# Patient Record
Sex: Male | Born: 1970 | Race: White | Hispanic: No | Marital: Married | Smoking: Former smoker
Health system: Southern US, Community
[De-identification: ages and names within clinical notes are randomized; demographics above are authoritative.]

## PROBLEM LIST (undated history)

## (undated) DIAGNOSIS — I219 Acute myocardial infarction, unspecified: Secondary | ICD-10-CM

## (undated) DIAGNOSIS — I2510 Atherosclerotic heart disease of native coronary artery without angina pectoris: Secondary | ICD-10-CM

## (undated) DIAGNOSIS — Z79890 Hormone replacement therapy (postmenopausal): Secondary | ICD-10-CM

## (undated) DIAGNOSIS — K219 Gastro-esophageal reflux disease without esophagitis: Secondary | ICD-10-CM

## (undated) DIAGNOSIS — E785 Hyperlipidemia, unspecified: Secondary | ICD-10-CM

## (undated) DIAGNOSIS — I10 Essential (primary) hypertension: Secondary | ICD-10-CM

## (undated) HISTORY — PX: CORONARY ARTERY BYPASS GRAFT: SHX141

---

## 2005-07-20 ENCOUNTER — Emergency Department: Payer: Self-pay | Admitting: Internal Medicine

## 2006-05-13 ENCOUNTER — Emergency Department (HOSPITAL_COMMUNITY): Admission: EM | Admit: 2006-05-13 | Discharge: 2006-05-13 | Payer: Self-pay | Admitting: Emergency Medicine

## 2008-01-15 IMAGING — CT CT HEAD WITHOUT CONTRAST
2 series · 16 of 30 positions shown, 20 images · non-contrast
Comparison: none

REASON FOR EXAM: Headache  [HOSPITAL]
COMMENTS:

PROCEDURE:     CT  - CT HEAD WITHOUT CONTRAST  - July 20, 2005  [DATE]
RESULT:       The ventricles are normal in size and position.  There is no
evidence of a mass nor mass effect.  There is no intracranial hemorrhage.
At bone window settings, I see no evidence of an acute skull fracture.

[Series 2: without · axial · non-contrast · 0.41mm/px · z∈[+738,+868]mm · 13 of 32 slices shown, 17 images]
[im 3/32  brain]
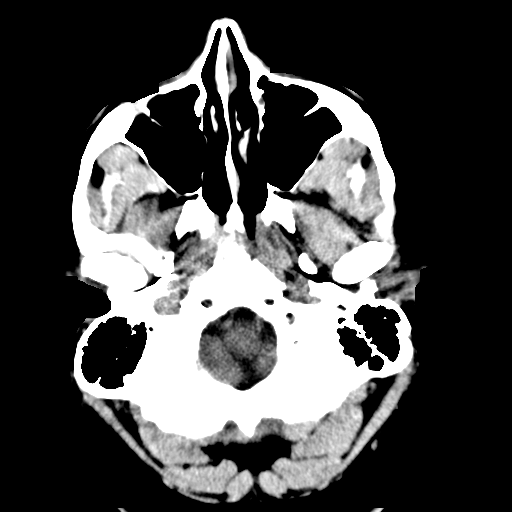
[im 3/32  bone]
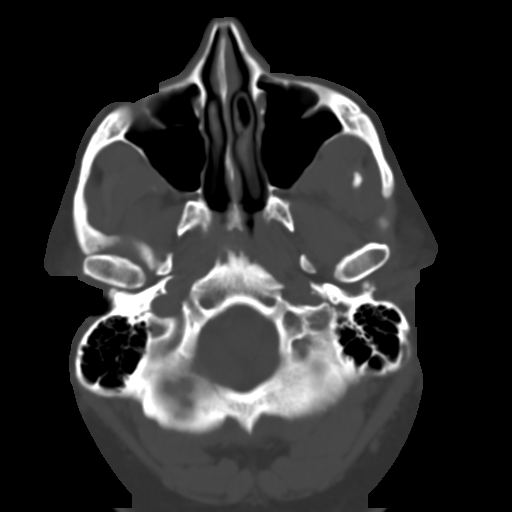
[im 5/32  brain]
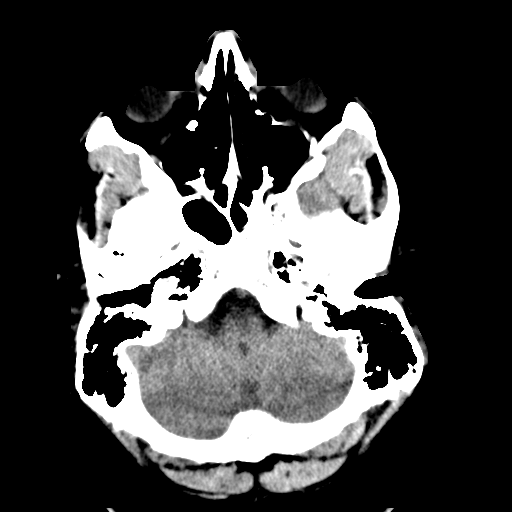
[im 7/32  brain]
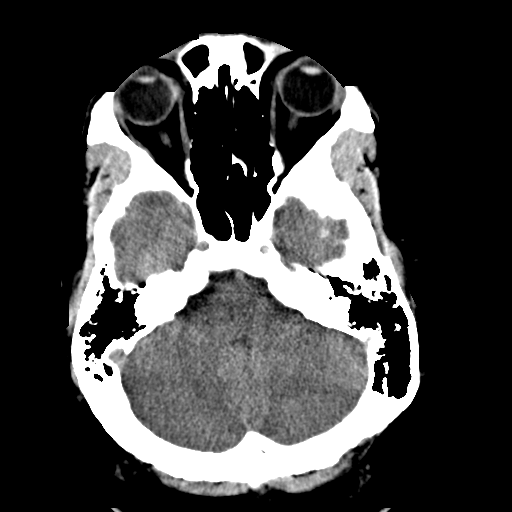
[im 9/32  brain]
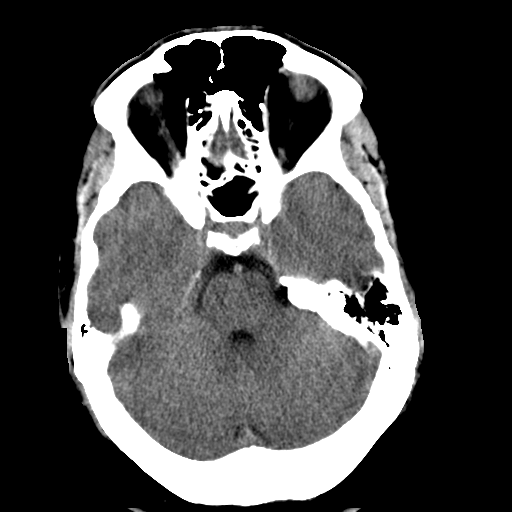
[im 12/32  brain]
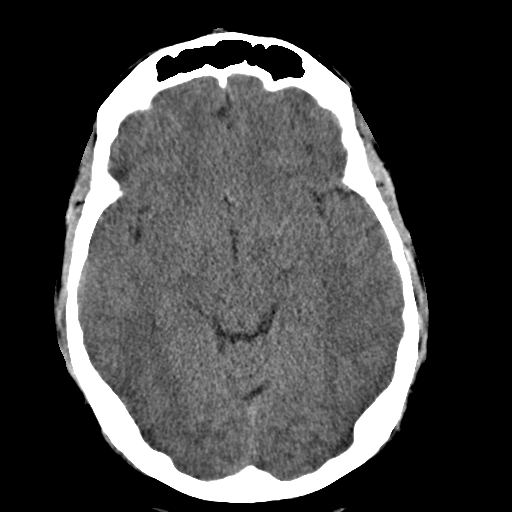
[im 12/32  bone]
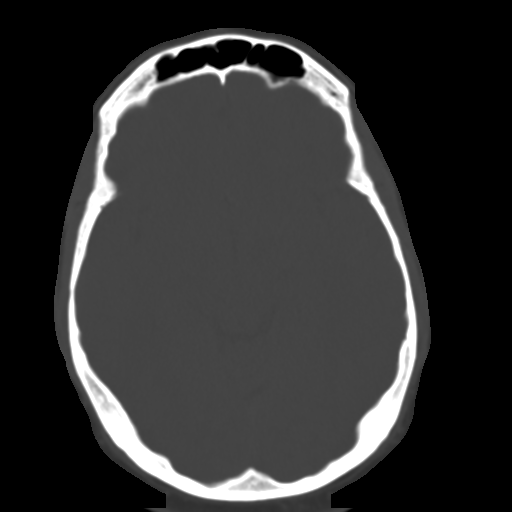
[im 14/32  brain]
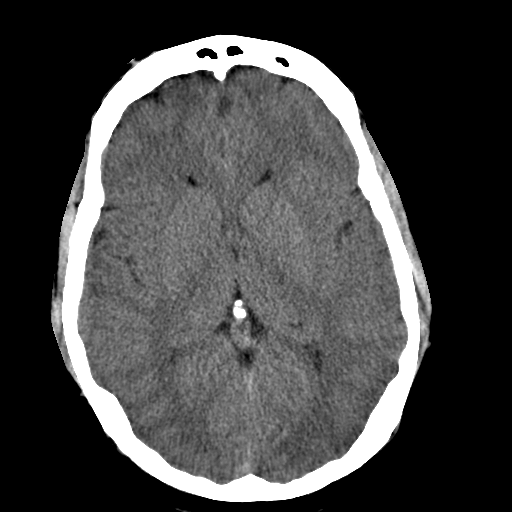
[im 16/32  brain]
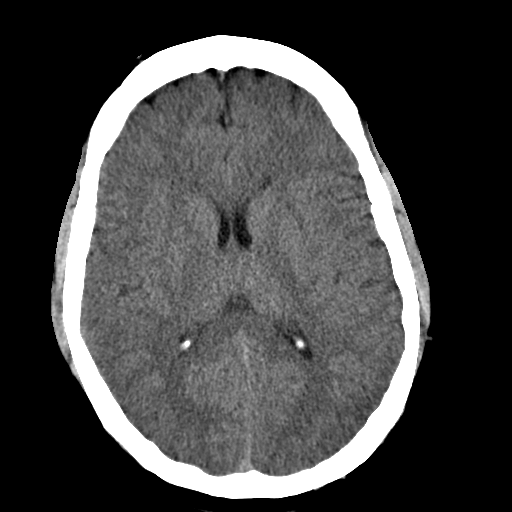
[im 18/32  brain]
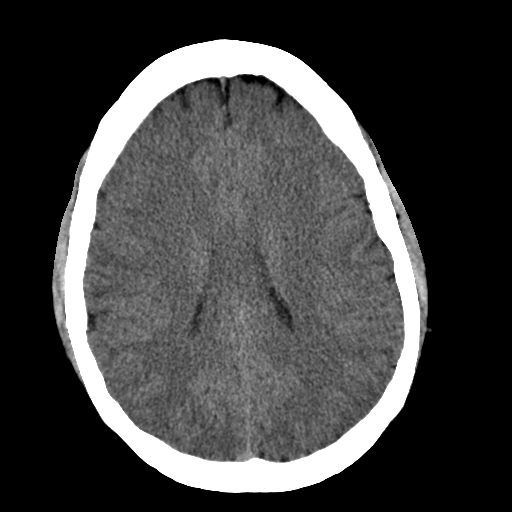
[im 20/32  brain]
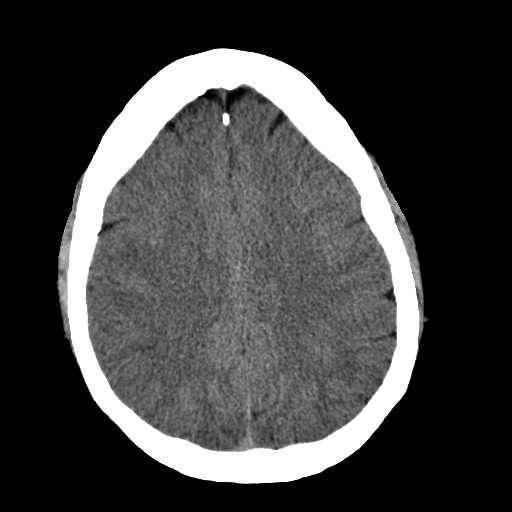
[im 20/32  bone]
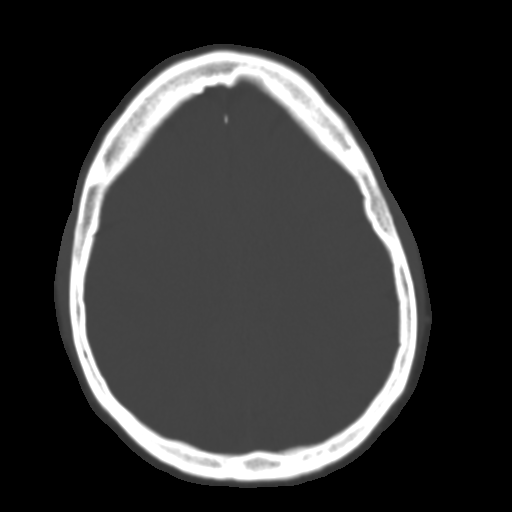
[im 23/32  brain]
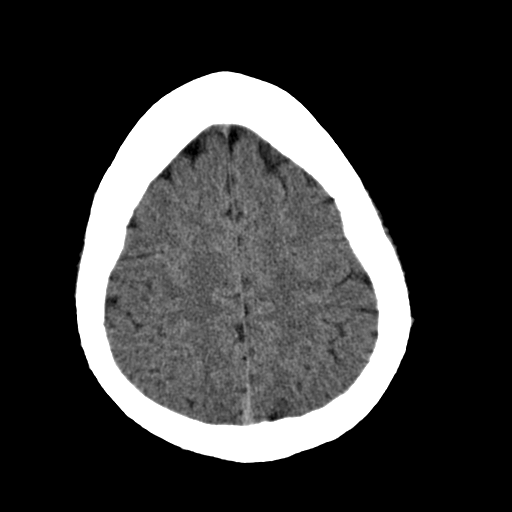
[im 25/32  brain]
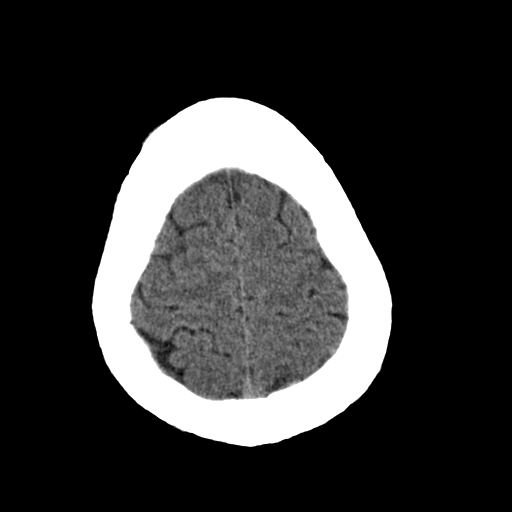
[im 27/32  brain]
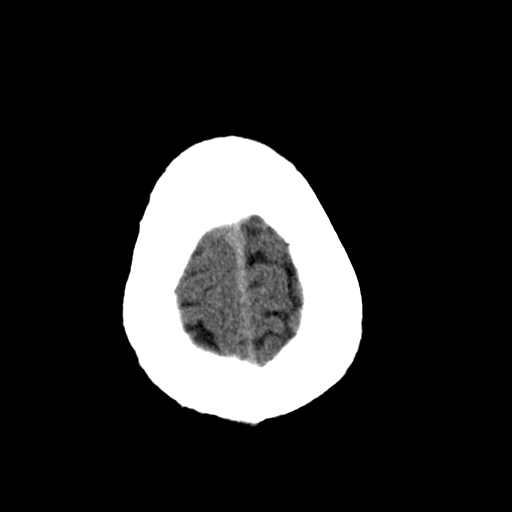
[im 29/32  brain]
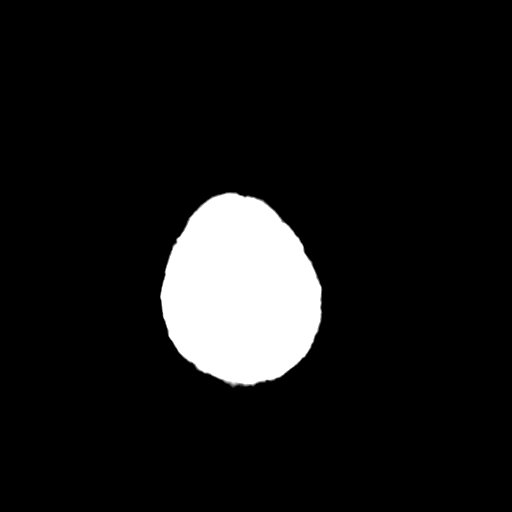
[im 29/32  bone]
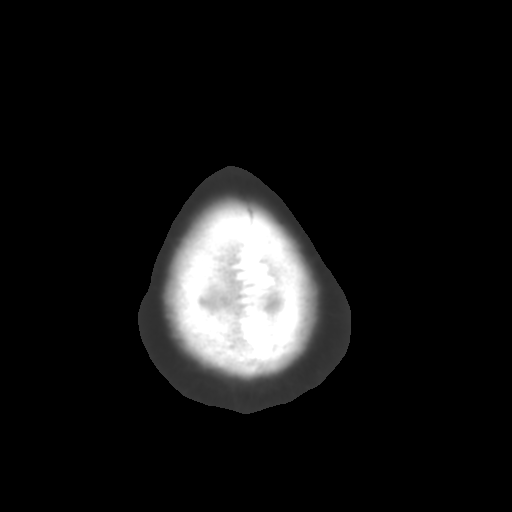

[Series 3: bone · axial · 0.41mm/px · z∈[+738,+783]mm · 3 of 32 slices shown]
[im 3/32  bone]
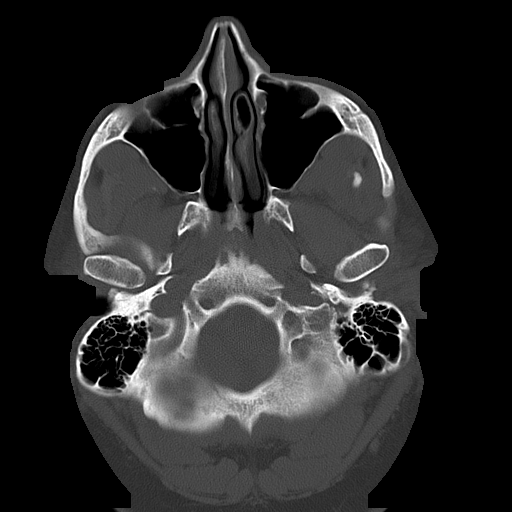
[im 7/32  bone]
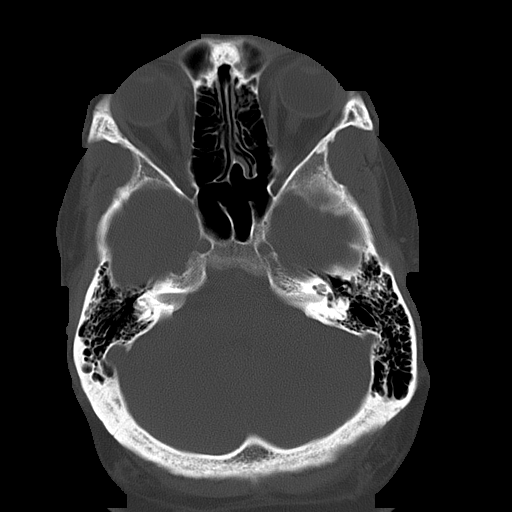
[im 12/32  bone]
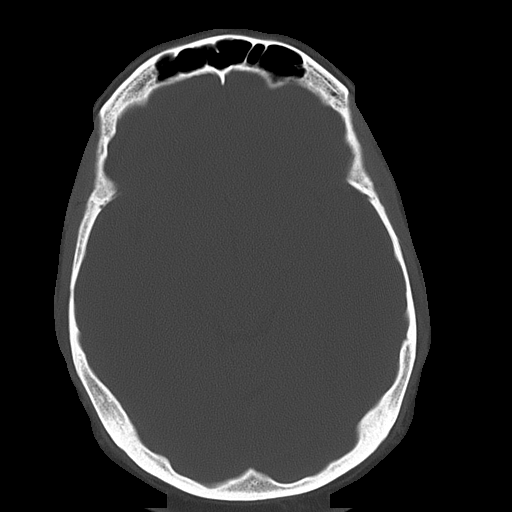

[16 of 30 positions shown; findings below may reference images not displayed]

IMPRESSION: I see no acute intracranial abnormality.

A preliminary report was sent to the Emergency Room at the conclusion of the
study.

## 2008-11-07 IMAGING — CT CT HEAD W/O CM
1 series · 16 of 30 positions shown, 20 images · IV contrast (agent unspecified)
Comparison: none

CLINICAL DATA: Dizziness and headache with light sensitivity and nausea. 
 HEAD CT WITHOUT CONTRAST:
TECHNIQUE: Contiguous axial CT images were obtained from the base of the skull through the vertex according to standard protocol without contrast.   
 No priors for comparison.

[Series 2: head routine 4.8 h47s · axial · 0.48mm/px · z∈[-147,+8]mm · 16 of 36 slices shown, 20 images]
[im 2/36  brain]
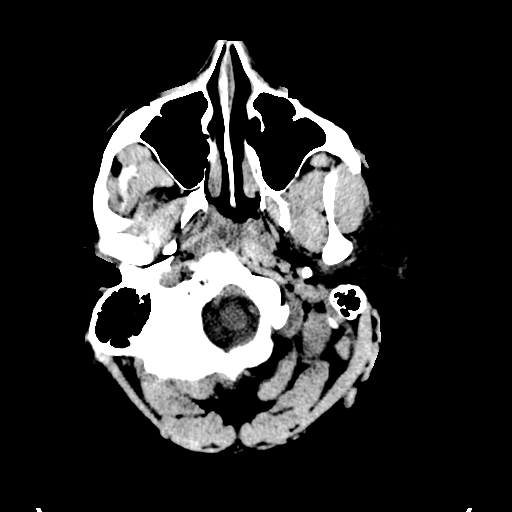
[im 2/36  bone]
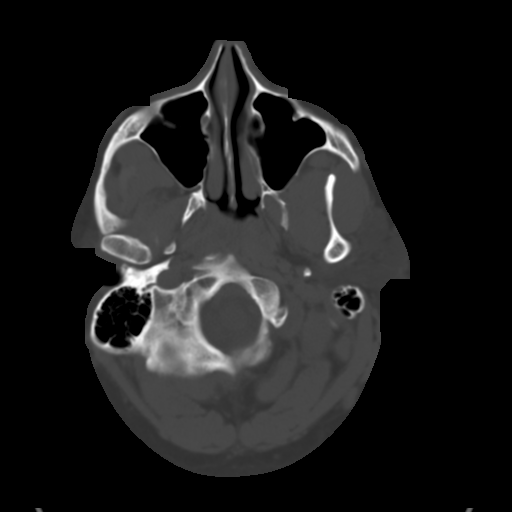
[im 4/36  brain]
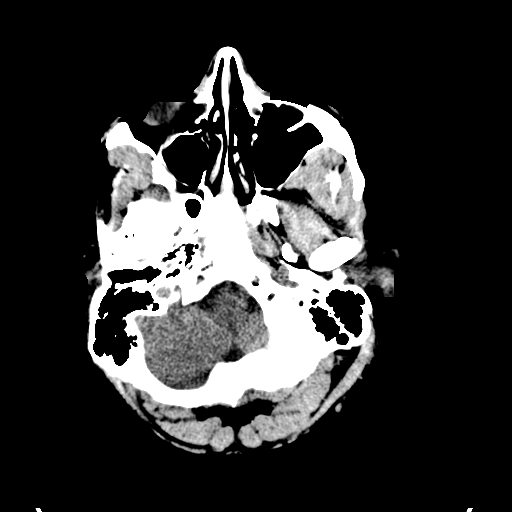
[im 7/36  brain]
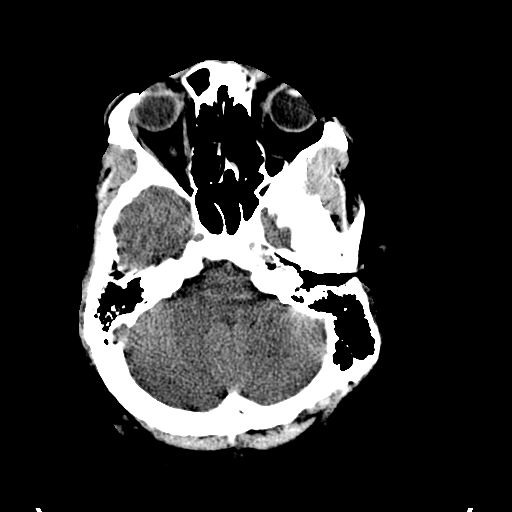
[im 9/36  brain]
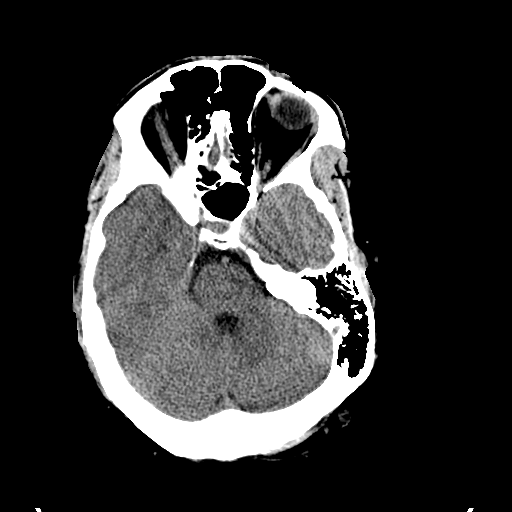
[im 10/36  brain]
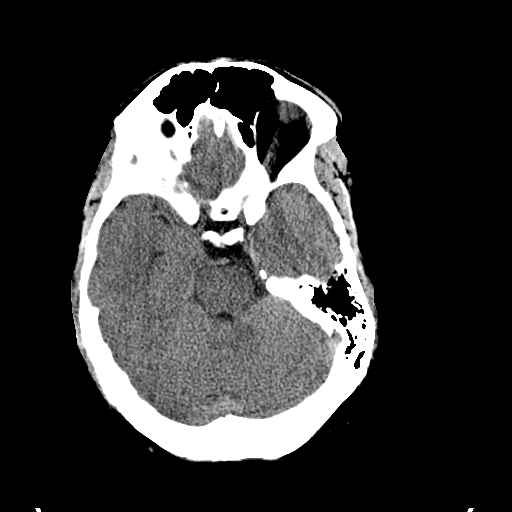
[im 10/36  bone]
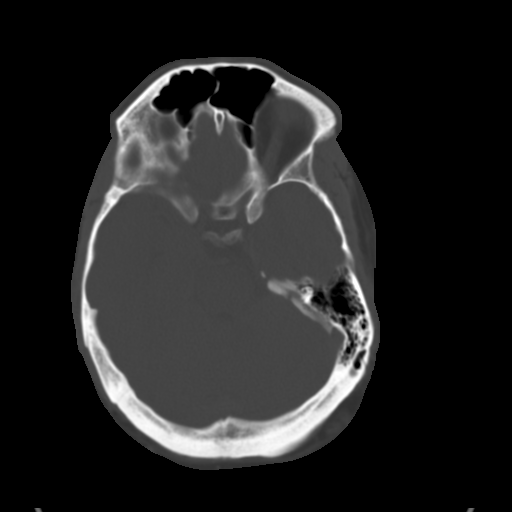
[im 13/36  brain]
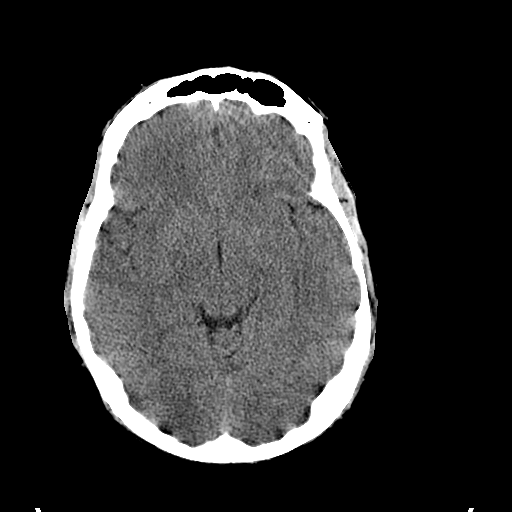
[im 15/36  brain]
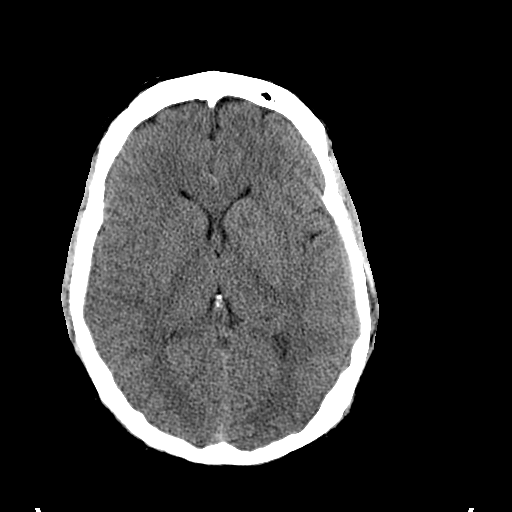
[im 17/36  brain]
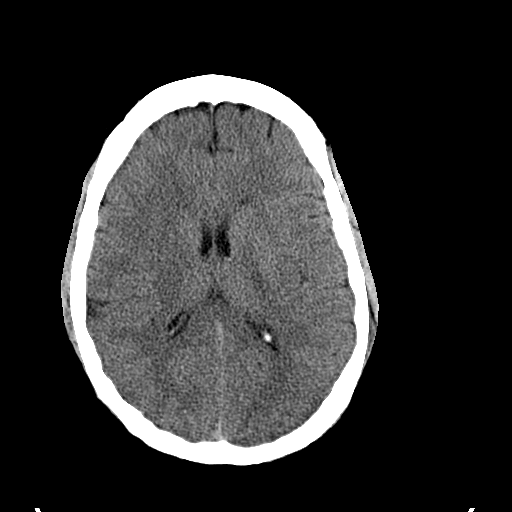
[im 19/36  brain]
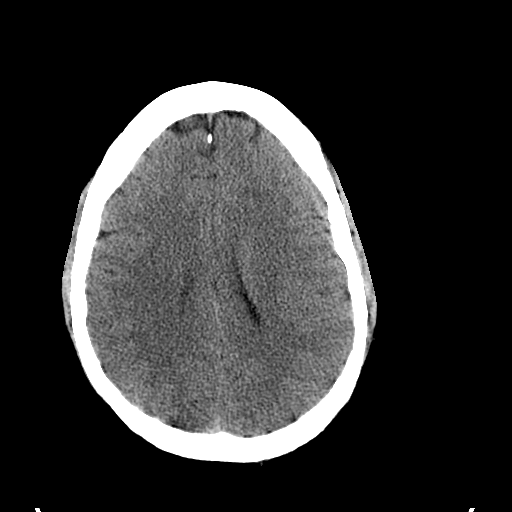
[im 19/36  bone]
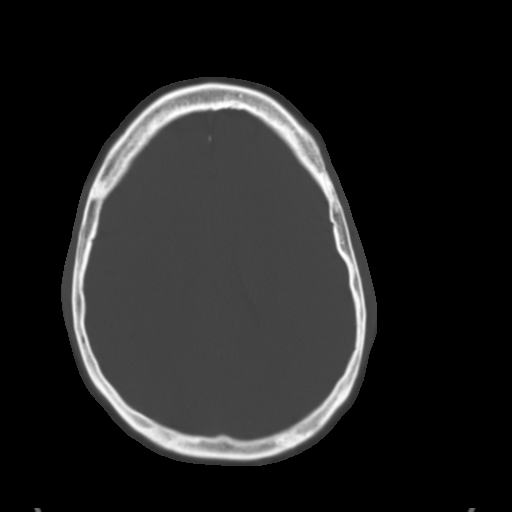
[im 21/36  brain]
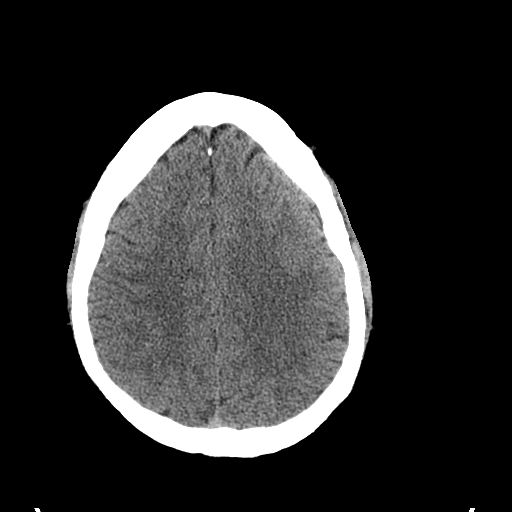
[im 23/36  brain]
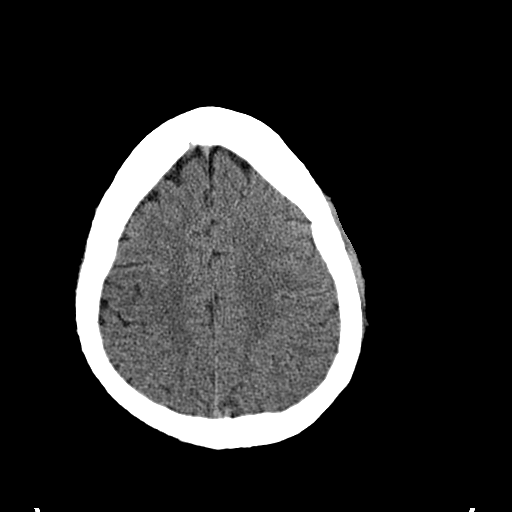
[im 26/36  brain]
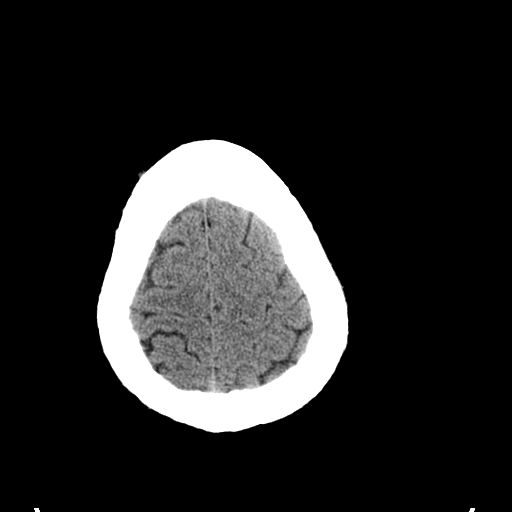
[im 27/36  brain]
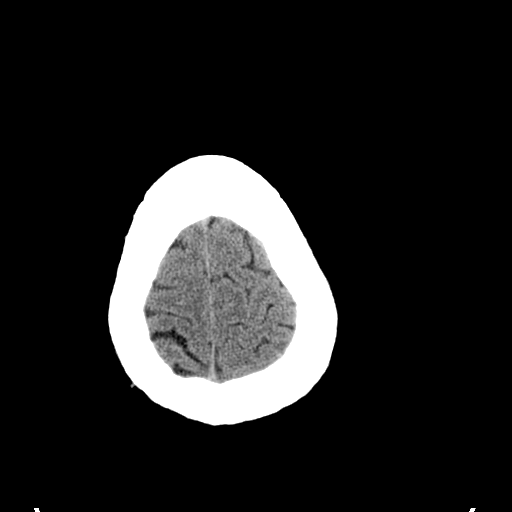
[im 27/36  bone]
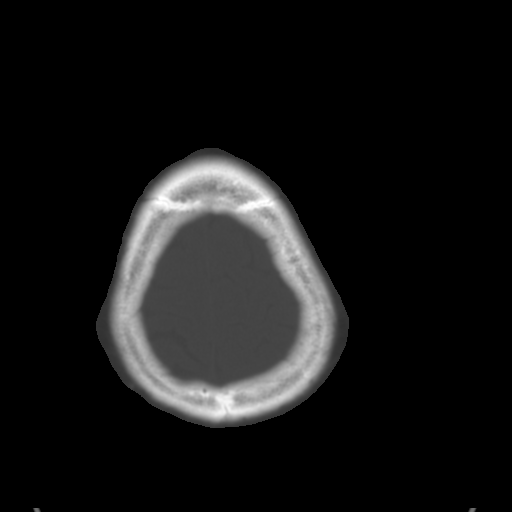
[im 29/36  brain]
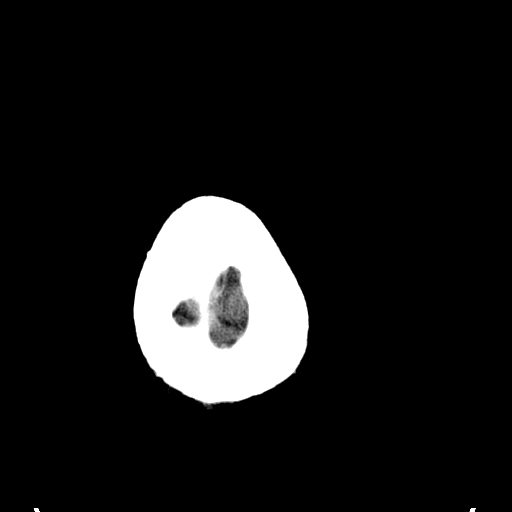
[im 32/36  brain]
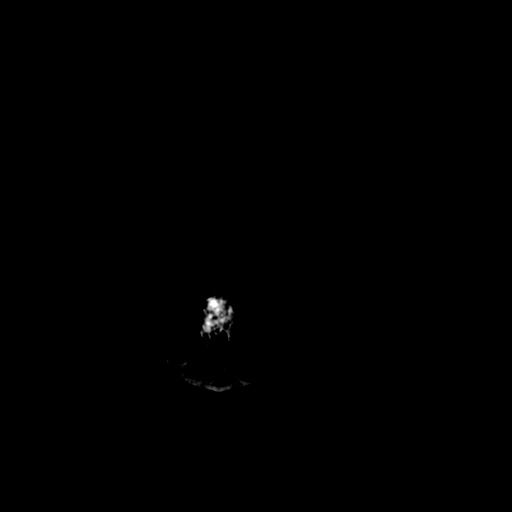
[im 34/36  brain]
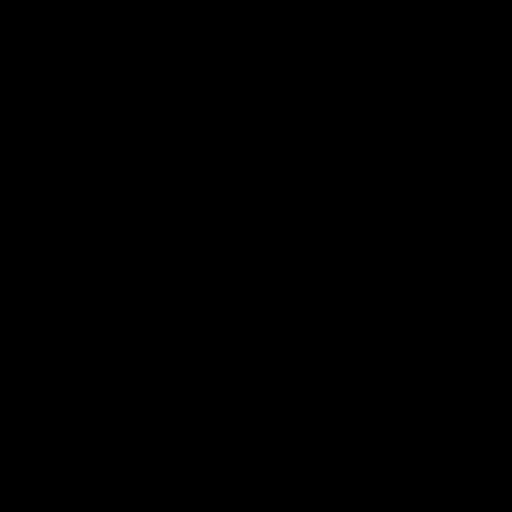

[16 of 30 positions shown; findings below may reference images not displayed]

FINDINGS: There is no evidence of intracranial hemorrhage, brain edema, acute infarct, mass lesion, or mass effect.  No other intra-axial abnormalities are seen, and the ventricles are within normal limits.  No abnormal extra-axial fluid collections or masses are identified.  No skull abnormalities are noted.
IMPRESSION: Negative non-contrast head CT.

## 2011-04-05 HISTORY — PX: OTHER SURGICAL HISTORY: SHX169

## 2013-06-20 DIAGNOSIS — I10 Essential (primary) hypertension: Secondary | ICD-10-CM | POA: Insufficient documentation

## 2013-06-20 DIAGNOSIS — I25810 Atherosclerosis of coronary artery bypass graft(s) without angina pectoris: Secondary | ICD-10-CM | POA: Insufficient documentation

## 2013-06-20 DIAGNOSIS — E785 Hyperlipidemia, unspecified: Secondary | ICD-10-CM | POA: Insufficient documentation

## 2013-10-21 DIAGNOSIS — E66811 Obesity, class 1: Secondary | ICD-10-CM | POA: Insufficient documentation

## 2020-07-01 ENCOUNTER — Ambulatory Visit (INDEPENDENT_AMBULATORY_CARE_PROVIDER_SITE_OTHER): Payer: 59 | Admitting: Dermatology

## 2020-07-01 ENCOUNTER — Encounter: Payer: Self-pay | Admitting: Dermatology

## 2020-07-01 ENCOUNTER — Other Ambulatory Visit: Payer: Self-pay

## 2020-07-01 DIAGNOSIS — L570 Actinic keratosis: Secondary | ICD-10-CM

## 2020-07-01 DIAGNOSIS — L821 Other seborrheic keratosis: Secondary | ICD-10-CM

## 2020-07-01 DIAGNOSIS — D485 Neoplasm of uncertain behavior of skin: Secondary | ICD-10-CM | POA: Diagnosis not present

## 2020-07-01 DIAGNOSIS — D1800 Hemangioma unspecified site: Secondary | ICD-10-CM

## 2020-07-01 DIAGNOSIS — L219 Seborrheic dermatitis, unspecified: Secondary | ICD-10-CM | POA: Diagnosis not present

## 2020-07-01 DIAGNOSIS — L918 Other hypertrophic disorders of the skin: Secondary | ICD-10-CM | POA: Diagnosis not present

## 2020-07-01 DIAGNOSIS — L578 Other skin changes due to chronic exposure to nonionizing radiation: Secondary | ICD-10-CM

## 2020-07-01 DIAGNOSIS — Z1283 Encounter for screening for malignant neoplasm of skin: Secondary | ICD-10-CM | POA: Diagnosis not present

## 2020-07-01 DIAGNOSIS — D2372 Other benign neoplasm of skin of left lower limb, including hip: Secondary | ICD-10-CM

## 2020-07-01 DIAGNOSIS — L738 Other specified follicular disorders: Secondary | ICD-10-CM

## 2020-07-01 DIAGNOSIS — D229 Melanocytic nevi, unspecified: Secondary | ICD-10-CM

## 2020-07-01 DIAGNOSIS — L814 Other melanin hyperpigmentation: Secondary | ICD-10-CM

## 2020-07-01 DIAGNOSIS — D2371 Other benign neoplasm of skin of right lower limb, including hip: Secondary | ICD-10-CM

## 2020-07-01 MED ORDER — KETOCONAZOLE 2 % EX CREA: TOPICAL_CREAM | CUTANEOUS | 2 refills | Status: DC

## 2020-07-01 NOTE — Progress Notes (Addendum)
New Patient Visit  Subjective  Richard Carlson is a 50 y.o. male who presents for the following: Annual Exam (Total body exam today. No hx of skin cancer or dysplastic nevi. Pt states that he has skin tags and other spots all over that he would like checked. ). He has a mole on his back that is irritating traumatized like removed.  He also has skin tags around his neck that is traumatized and irritating he would like these also removed.  He has a set of his eyebrows and nasolabial areas.  He wonders about treatment for that. The patient presents for Total-Body Skin Exam (TBSE) for skin cancer screening and mole check. Patient here for full body skin exam and skin cancer screening.  Pt accompanied by wife who contributes to history.   Objective  Well appearing patient in no apparent distress; mood and affect are within normal limits.  A full examination was performed including scalp, head, eyes, ears, nose, lips, neck, chest, axillae, abdomen, back, buttocks, bilateral upper extremities, bilateral lower extremities, hands, feet, fingers, toes, fingernails, and toenails. All findings within normal limits unless otherwise noted below.  Objective  Scalp: Pink patches with greasy scale.   Objective  1.5 cm lat to left brow: Small yellow papules with a central dell.  0.4 cm papule  Images    Objective  Right infrascapular: Right infrascapular Shave 1.1 cm irritated nevus R/o atypia   Objective  Neck - Anterior (14): Fleshy, skin-colored pedunculated papules.    Objective  Left Ear x 2: Erythematous thin papules/macules with gritty scale.   Assessment & Plan   Seborrheic dermatitis Scalp Seborrheic Dermatitis  -  is a chronic persistent rash characterized by pinkness and scaling most commonly of the mid face but also can occur on the scalp (dandruff), ears; mid chest and mid back. It tends to be exacerbated by stress and cooler weather.  People who have neurologic  disease may experience new onset or exacerbation of existing seborrheic dermatitis.  The condition is not curable but treatable and can be controlled.  Start Ketoconazole cream Apply to affected areas QHS on Mondays, Wednesdays, and Fridays.  ketoconazole (NIZORAL) 2 % cream - Scalp  Sebaceous hyperplasia of face 1.5 cm lat to left brow Benign-appearing.  Observation.  Call clinic for new or changing lesions.  Recommend daily use of broad spectrum spf 30+ sunscreen to sun-exposed areas.   Neoplasm of uncertain behavior of skin Right infrascapular Epidermal / dermal shaving  Lesion diameter (cm):  1.1 Informed consent: discussed and consent obtained   Timeout: patient name, date of birth, surgical site, and procedure verified   Procedure prep:  Patient was prepped and draped in usual sterile fashion Prep type:  Isopropyl alcohol Anesthesia: the lesion was anesthetized in a standard fashion   Anesthetic:  1% lidocaine w/ epinephrine 1-100,000 buffered w/ 8.4% NaHCO3 Instrument used: flexible razor blade   Hemostasis achieved with: pressure, aluminum chloride and electrodesiccation   Outcome: patient tolerated procedure well   Post-procedure details: sterile dressing applied and wound care instructions given   Dressing type: bandage and petrolatum    Specimen 1 - Surgical pathology Differential Diagnosis: R/o atypia  Check Margins: No Right infrascapular 1.1 cm irritated nevus  Skin tag (14) Neck - Anterior Discussed with pt that cosmetic skin tag removal may not be covered by insurance. Pt agreed to $115 out of pocket for procedure. Non-covered form signed by pt.   12 skin tags treated with Ln2 2 removed  with snip excisions  Epidermal / dermal shaving - Neck - Anterior  Informed consent: discussed and consent obtained   Patient was prepped and draped in usual sterile fashion: Area prepped with alcohol. Anesthesia: the lesion was anesthetized in a standard fashion    Anesthetic:  1% lidocaine w/ epinephrine 1-100,000 buffered w/ 8.4% NaHCO3 Instrument used: scissors   Hemostasis achieved with: pressure, aluminum chloride and electrodesiccation   Outcome: patient tolerated procedure well   Post-procedure details: wound care instructions given    Destruction of lesion - Neck - Anterior Complexity: simple   Destruction method: cryotherapy   Informed consent: discussed and consent obtained   Timeout:  patient name, date of birth, surgical site, and procedure verified Lesion destroyed using liquid nitrogen: Yes   Region frozen until ice ball extended beyond lesion: Yes   Outcome: patient tolerated procedure well with no complications   Post-procedure details: wound care instructions given    AK (actinic keratosis) Left Ear x 2 Prior to procedure, discussed risks of blister formation, small wound, skin dyspigmentation, or rare scar following cryotherapy.   Destruction of lesion - Left Ear x 2 Complexity: simple   Destruction method: cryotherapy   Informed consent: discussed and consent obtained   Timeout:  patient name, date of birth, surgical site, and procedure verified Lesion destroyed using liquid nitrogen: Yes   Region frozen until ice ball extended beyond lesion: Yes   Outcome: patient tolerated procedure well with no complications   Post-procedure details: wound care instructions given    Skin cancer screening    Lentigines - Scattered tan macules - Due to sun exposure - Benign-appering, observe - Recommend daily broad spectrum sunscreen SPF 30+ to sun-exposed areas, reapply every 2 hours as needed. - Call for any changes  Seborrheic Keratoses - Stuck-on, waxy, tan-brown papules and/or plaques  - Benign-appearing - Discussed benign etiology and prognosis. - Observe - Call for any changes  Melanocytic Nevi - Tan-brown and/or pink-flesh-colored symmetric macules and papules - Benign appearing on exam today - Observation - Call  clinic for new or changing moles - Recommend daily use of broad spectrum spf 30+ sunscreen to sun-exposed areas.   Hemangiomas - Red papules - Discussed benign nature - Observe - Call for any changes  Dermatofibroma Left knee x 2, right lateral ankle x 1 - Firm pink/brown papulenodule with dimple sign - Benign appearing - Call for any changes  Actinic Damage - Chronic condition, secondary to cumulative UV/sun exposure - diffuse scaly erythematous macules with underlying dyspigmentation - Recommend daily broad spectrum sunscreen SPF 30+ to sun-exposed areas, reapply every 2 hours as needed.  - Staying in the shade or wearing long sleeves, sun glasses (UVA+UVB protection) and wide brim hats (4-inch brim around the entire circumference of the hat) are also recommended for sun protection.  - Call for new or changing lesions.  Skin cancer screening performed today.  Return in about 6 months (around 01/01/2021) for AK f/u.   IHarriett Sine, CMA, am acting as scribe for Sarina Ser, MD.  Documentation: I have reviewed the above documentation for accuracy and completeness, and I agree with the above.  Sarina Ser, MD

## 2020-07-01 NOTE — Patient Instructions (Addendum)
Skin Tag Removal Wound Care Instructions  . Sometimes bandages are not necessary.  If a bandage is used, leave the original bandage on for 24 hours if possible.  If the bandage becomes soaked or soiled before that time, it is OK to remove it and examine the wound.  A small amount of post-operative bleeding is normal.  If excessive bleeding occurs, remove the bandage, place gauze over the site and apply continuous pressure (no peeking) over the area for 20-30 minutes.  If this does not stop the bleeding, try again for 40 minutes.  If this does not work, please call our clinic as soon as possible (even if after hours).    . Once a day, cleanse the wound with soap and water.  If a thick crust develops you may use a Q-tip dipped into dilute hydrogen peroxide (mix 1:1 with water) to dissolve it.  Hydrogen peroxide can slow the healing process, so use it only as needed.  After washing, apply Vaseline jelly or Polysporin ointment.  For best healing, the wound should be covered with a layer of ointment at all times.  This may mean re-applying the ointment several times a day.  For open wounds, continue until it has healed.    . If you have any swelling, keep the area elevated.  . Some redness, tenderness and white or yellow material in the wound is normal healing.  If the area becomes very sore and red, or develops a thick yellow-green material (pus), it may be infected; please notify us.    . Wound healing continues for up to one year following surgery.  It is not unusual to experience pain in the scar from time to time during the interval.  If the pain becomes severe or the scar thickens, you should notify the office.  A slight amount of redness in a scar is expected for the first six months.  After six months, the redness subsides and the scar will soften and fade.  The color difference becomes less noticeable with time.  If there are any problems, return for a post-op surgery check at your earliest  convenience.  . It is important to wear sunscreen daily with an SPF of 30 or higher over the treated sites and to wear sun-protective clothing.  . Please call our office at [phone] for any questions or concerns.      Wound Care Instructions  1. Cleanse wound gently with soap and water once a day then pat dry with clean gauze. Apply a thing coat of Petrolatum (petroleum jelly, "Vaseline") over the wound (unless you have an allergy to this). We recommend that you use a new, sterile tube of Vaseline. Do not pick or remove scabs. Do not remove the yellow or white "healing tissue" from the base of the wound.  2. Cover the wound with fresh, clean, nonstick gauze and secure with paper tape. You may use Band-Aids in place of gauze and tape if the would is small enough, but would recommend trimming much of the tape off as there is often too much. Sometimes Band-Aids can irritate the skin.  3. You should call the office for your biopsy report after 1 week if you have not already been contacted.  4. If you experience any problems, such as abnormal amounts of bleeding, swelling, significant bruising, significant pain, or evidence of infection, please call the office immediately.  5. FOR ADULT SURGERY PATIENTS: If you need something for pain relief you may take 1 extra  strength Tylenol (acetaminophen) AND 2 Ibuprofen (200mg  each) together every 4 hours as needed for pain. (do not take these if you are allergic to them or if you have a reason you should not take them.) Typically, you may only need pain medication for 1 to 3 days.

## 2020-07-07 ENCOUNTER — Telehealth: Payer: Self-pay

## 2020-07-07 NOTE — Telephone Encounter (Signed)
-----   Message from Ralene Bathe, MD sent at 07/02/2020  6:37 PM EDT ----- Diagnosis Skin , right infrascapular [place]  Benign skin tag

## 2020-07-07 NOTE — Telephone Encounter (Signed)
Discussed biopsy results with pt  °

## 2020-07-13 ENCOUNTER — Encounter: Payer: Self-pay | Admitting: Dermatology

## 2020-07-16 DIAGNOSIS — I10 Essential (primary) hypertension: Secondary | ICD-10-CM | POA: Insufficient documentation

## 2020-07-16 DIAGNOSIS — E782 Mixed hyperlipidemia: Secondary | ICD-10-CM | POA: Insufficient documentation

## 2020-12-10 ENCOUNTER — Encounter: Payer: Self-pay | Admitting: *Deleted

## 2020-12-11 ENCOUNTER — Other Ambulatory Visit: Payer: Self-pay

## 2020-12-11 ENCOUNTER — Ambulatory Visit: Payer: 59 | Admitting: Anesthesiology

## 2020-12-11 ENCOUNTER — Encounter
Admission: RE | Disposition: A | Discharge status: Home / Self Care | Payer: Self-pay | Source: Home / Self Care | Attending: Gastroenterology

## 2020-12-11 ENCOUNTER — Encounter: Payer: Self-pay | Admitting: *Deleted

## 2020-12-11 ENCOUNTER — Ambulatory Visit
Admission: RE | Admit: 2020-12-11 | Discharge: 2020-12-11 | Disposition: A | Discharge status: Home / Self Care | Payer: 59 | Attending: Gastroenterology | Admitting: Gastroenterology

## 2020-12-11 DIAGNOSIS — Z791 Long term (current) use of non-steroidal anti-inflammatories (NSAID): Secondary | ICD-10-CM | POA: Insufficient documentation

## 2020-12-11 DIAGNOSIS — Z7982 Long term (current) use of aspirin: Secondary | ICD-10-CM | POA: Insufficient documentation

## 2020-12-11 DIAGNOSIS — Z79899 Other long term (current) drug therapy: Secondary | ICD-10-CM | POA: Insufficient documentation

## 2020-12-11 DIAGNOSIS — D123 Benign neoplasm of transverse colon: Secondary | ICD-10-CM | POA: Diagnosis not present

## 2020-12-11 DIAGNOSIS — K640 First degree hemorrhoids: Secondary | ICD-10-CM | POA: Diagnosis not present

## 2020-12-11 DIAGNOSIS — Z951 Presence of aortocoronary bypass graft: Secondary | ICD-10-CM | POA: Diagnosis not present

## 2020-12-11 DIAGNOSIS — Z1211 Encounter for screening for malignant neoplasm of colon: Secondary | ICD-10-CM | POA: Diagnosis not present

## 2020-12-11 DIAGNOSIS — I2510 Atherosclerotic heart disease of native coronary artery without angina pectoris: Secondary | ICD-10-CM | POA: Diagnosis not present

## 2020-12-11 HISTORY — DX: Hormone replacement therapy: Z79.890

## 2020-12-11 HISTORY — DX: Hyperlipidemia, unspecified: E78.5

## 2020-12-11 HISTORY — DX: Gastro-esophageal reflux disease without esophagitis: K21.9

## 2020-12-11 HISTORY — DX: Essential (primary) hypertension: I10

## 2020-12-11 HISTORY — PX: COLONOSCOPY WITH PROPOFOL: SHX5780

## 2020-12-11 HISTORY — DX: Acute myocardial infarction, unspecified: I21.9

## 2020-12-11 HISTORY — DX: Atherosclerotic heart disease of native coronary artery without angina pectoris: I25.10

## 2020-12-11 SURGERY — COLONOSCOPY WITH PROPOFOL
Anesthesia: General

## 2020-12-11 MED ORDER — LIDOCAINE HCL (CARDIAC) PF 100 MG/5ML IV SOSY
PREFILLED_SYRINGE | INTRAVENOUS | Status: DC | PRN
  Administered 2020-12-11: 50 mg via INTRAVENOUS

## 2020-12-11 MED ORDER — PROPOFOL 500 MG/50ML IV EMUL
INTRAVENOUS | Status: DC | PRN
  Administered 2020-12-11: 125 ug/kg/min via INTRAVENOUS

## 2020-12-11 MED ORDER — SODIUM CHLORIDE 0.9 % IV SOLN: INTRAVENOUS | Status: DC

## 2020-12-11 MED ORDER — PROPOFOL 10 MG/ML IV BOLUS
INTRAVENOUS | Status: DC | PRN
  Administered 2020-12-11: 20 mg via INTRAVENOUS
  Administered 2020-12-11: 60 mg via INTRAVENOUS

## 2020-12-11 NOTE — Interval H&P Note (Signed)
History and Physical Interval Note:  12/11/2020 9:55 AM  Richard Carlson  has presented today for surgery, with the diagnosis of COLON CANCER SCREENING.  The various methods of treatment have been discussed with the patient and family. After consideration of risks, benefits and other options for treatment, the patient has consented to  Procedure(s): COLONOSCOPY WITH PROPOFOL (N/A) as a surgical intervention.  The patient's history has been reviewed, patient examined, no change in status, stable for surgery.  I have reviewed the patient's chart and labs.  Questions were answered to the patient's satisfaction.     [name]  Ok to proceed with colonoscopy

## 2020-12-11 NOTE — Anesthesia Preprocedure Evaluation (Signed)
Anesthesia Evaluation  Patient identified by MRN, date of birth, ID band Patient awake    Reviewed: Allergy & Precautions, NPO status , Patient's Chart, lab work & pertinent test results, reviewed documented beta blocker date and time   Airway Mallampati: II  TM Distance: >3 FB Neck ROM: Full    Dental  (+) Edentulous Upper, Edentulous Lower   Pulmonary neg pulmonary ROS, former smoker,    Pulmonary exam normal        Cardiovascular hypertension, Pt. on medications and Pt. on home beta blockers + CAD, + Past MI and + CABG  Normal cardiovascular exam     Neuro/Psych negative neurological ROS  negative psych ROS   GI/Hepatic Neg liver ROS, Bowel prep,GERD  Medicated,  Endo/Other  negative endocrine ROS  Renal/GU negative Renal ROS  negative genitourinary   Musculoskeletal negative musculoskeletal ROS (+)   Abdominal   Peds negative pediatric ROS (+)  Hematology negative hematology ROS (+)   Anesthesia Other Findings . CAD (coronary artery disease) 06/30/2020  . Hyperlipidemia  . Hypertension  . Long-term current use of testosterone replacement therapy 06/30/2020     Reproductive/Obstetrics negative OB ROS                             Anesthesia Physical Anesthesia Plan  ASA: 3  Anesthesia Plan: General   Post-op Pain Management:    Induction: Intravenous  PONV Risk Score and Plan: 2 and Propofol infusion  Airway Management Planned: Natural Airway and Nasal Cannula  Additional Equipment:   Intra-op Plan:   Post-operative Plan:   Informed Consent: I have reviewed the patients History and Physical, chart, labs and discussed the procedure including the risks, benefits and alternatives for the proposed anesthesia with the patient or authorized representative who has indicated his/her understanding and acceptance.       Plan Discussed with: CRNA, Anesthesiologist and  Surgeon  Anesthesia Plan Comments:         Anesthesia Quick Evaluation

## 2020-12-11 NOTE — H&P (Signed)
Outpatient short stay form Pre-procedure 12/11/2020  [name], MD  Primary Physician: Kirk Ruths, MD  Reason for visit:  Screening Colonoscopy  History of present illness:   50 y/o gentleman with history of CAD s/p CABG here for screening colonoscopy. No blood thinners. No significant abdominal surgeries. No first degree relatives with GI malignancies.    Current Facility-Administered Medications:    0.9 %  sodium chloride infusion, , Intravenous, Continuous, Simi Briel, Hilton Cork, MD, Last Rate: 20 mL/hr at 12/11/20 0953, New Bag at 12/11/20 0953  Medications Prior to Admission  Medication Sig Dispense Refill Last Dose   aspirin 81 MG EC tablet Take 1 tablet by mouth daily.   Past Week   atorvastatin (LIPITOR) 80 MG tablet Take 1 tablet by mouth daily.   Past Week   cetirizine (ZYRTEC) 10 MG tablet Take 1 tablet by mouth daily.   Past Week   cyanocobalamin 100 MCG tablet Take 1 tablet by mouth daily.   Past Week   DOCOSAHEXAENOIC ACID-EPA PO Take by mouth.   Past Week   ezetimibe (ZETIA) 10 MG tablet Take 10 mg by mouth daily.   Past Week   INULIN FIBER PREBIOTIC PO Take 2 capsules by mouth 2 (two) times daily.   Past Week   metoprolol tartrate (LOPRESSOR) 50 MG tablet Take 50 mg by mouth 2 (two) times daily.   12/11/2020   Multiple Vitamin (MULTIVITAMIN ADULT PO) Take 1 tablet by mouth daily.   12/10/2020   naproxen (NAPROSYN) 250 MG tablet Take by mouth.   Past Week   omeprazole (PRILOSEC) 20 MG capsule Take 20 mg by mouth daily.   Past Week   valsartan (DIOVAN) 80 MG tablet Take 80 mg by mouth daily.   12/10/2020   ketoconazole (NIZORAL) 2 % cream Apply to affected areas every night on Mondays, Wednesdays, and Fridays. 30 g 2    lisinopril (ZESTRIL) 10 MG tablet Take 10 mg by mouth daily.      testosterone cypionate (DEPOTESTOSTERONE CYPIONATE) 200 MG/ML injection Inject into the muscle.        No Known Allergies   Past Medical History:  Diagnosis Date    Coronary artery disease    GERD (gastroesophageal reflux disease)    HLD (hyperlipidemia)    Hypertension    Long-term current use of testosterone replacement therapy    Myocardial infarction [facility])     Review of systems:  Otherwise negative.    Physical Exam  Gen: Alert, oriented. Appears stated age.  HEENT: PERRLA. Lungs: No respiratory distress CV: RRR Abd: soft, benign, no masses. Ext: No edema    Planned procedures: Proceed with colonoscopy. The patient understands the nature of the planned procedure, indications, risks, alternatives and potential complications including but not limited to bleeding, infection, perforation, damage to internal organs and possible oversedation/side effects from anesthesia. The patient agrees and gives consent to proceed.  Please refer to procedure notes for findings, recommendations and patient disposition/instructions.     [name], MD [facility]

## 2020-12-11 NOTE — Op Note (Signed)
[facility] Gastroenterology Patient Name: Richard Carlson Procedure Date: 12/11/2020 9:44 AM MRN: [id number] Account #: [id number] Date of Birth: Jul 03, 1970 Admit Type: Outpatient Age: 50 Room: [facility] ENDO ROOM 3 Gender: Male Note Status: Finalized Instrument Name: [name] [id number] Procedure:             Colonoscopy Indications:           Screening for colorectal malignant neoplasm Providers:             Andrey Farmer MD, MD Referring MD:          Ocie Cornfield. Ouida Sills MD, MD (Referring MD) Medicines:             Monitored Anesthesia Care Complications:         No immediate complications. Estimated blood loss:                         Minimal. Procedure:             Pre-Anesthesia Assessment:                        - Prior to the procedure, a History and Physical was                         performed, and patient medications and allergies were                         reviewed. The patient is competent. The risks and                         benefits of the procedure and the sedation options and                         risks were discussed with the patient. All questions                         were answered and informed consent was obtained.                         Patient identification and proposed procedure were                         verified by the physician, the nurse, the anesthetist                         and the technician in the endoscopy suite. Mental                         Status Examination: alert and oriented. Airway                         Examination: normal oropharyngeal airway and neck                         mobility. Respiratory Examination: clear to                         auscultation. CV Examination: normal. Prophylactic  Antibiotics: The patient does not require prophylactic                         antibiotics. Prior Anticoagulants: The patient has                         taken no previous anticoagulant or  antiplatelet agents                         except for aspirin. ASA Grade Assessment: III - A                         patient with severe systemic disease. After reviewing                         the risks and benefits, the patient was deemed in                         satisfactory condition to undergo the procedure. The                         anesthesia plan was to use monitored anesthesia care                         (MAC). Immediately prior to administration of                         medications, the patient was re-assessed for adequacy                         to receive sedatives. The heart rate, respiratory                         rate, oxygen saturations, blood pressure, adequacy of                         pulmonary ventilation, and response to care were                         monitored throughout the procedure. The physical                         status of the patient was re-assessed after the                         procedure.                        After obtaining informed consent, the colonoscope was                         passed under direct vision. Throughout the procedure,                         the patient's blood pressure, pulse, and oxygen                         saturations were monitored continuously. The  Colonoscope was introduced through the anus and                         advanced to the the cecum, identified by appendiceal                         orifice and ileocecal valve. The colonoscopy was                         performed without difficulty. The patient tolerated                         the procedure well. The quality of the bowel                         preparation was good. Findings:      The perianal and digital rectal examinations were normal.      A 2 mm polyp was found in the hepatic flexure. The polyp was sessile.       The polyp was removed with a jumbo cold forceps. Resection and retrieval       were complete. Estimated  blood loss was minimal.      Internal hemorrhoids were found during retroflexion. The hemorrhoids       were Grade I (internal hemorrhoids that do not prolapse).      The exam was otherwise without abnormality on direct and retroflexion       views. Impression:            - One 2 mm polyp at the hepatic flexure, removed with                         a jumbo cold forceps. Resected and retrieved.                        - Internal hemorrhoids.                        - The examination was otherwise normal on direct and                         retroflexion views. Recommendation:        - Discharge patient to home.                        - Resume previous diet.                        - Continue present medications.                        - Await pathology results.                        - Repeat colonoscopy for surveillance based on                         pathology results.                        - Return to referring physician as previously  scheduled. Procedure Code(s):     --- Professional ---                        [phone], Colonoscopy, flexible; with biopsy, single or                         multiple Diagnosis Code(s):     --- Professional ---                        Z12.11, Encounter for screening for malignant neoplasm                         of colon                        K63.5, Polyp of colon                        K64.0, First degree hemorrhoids CPT copyright 2019 American Medical Association. All rights reserved. The codes documented in this report are preliminary and upon coder review may  be revised to meet current compliance requirements. Andrey Farmer MD, MD 12/11/2020 10:24:21 AM Number of Addenda: 0 Note Initiated On: 12/11/2020 9:44 AM Scope Withdrawal Time: 0 hours 7 minutes 29 seconds  Total Procedure Duration: 0 hours 18 minutes 14 seconds  Estimated Blood Loss:  Estimated blood loss was minimal.      [facility]

## 2020-12-11 NOTE — Anesthesia Postprocedure Evaluation (Signed)
Anesthesia Post Note  Patient: Richard Carlson  Procedure(s) Performed: COLONOSCOPY WITH PROPOFOL  Patient location during evaluation: Phase II Anesthesia Type: General Level of consciousness: awake and alert, awake and oriented Pain management: pain level controlled Vital Signs Assessment: post-procedure vital signs reviewed and stable Respiratory status: spontaneous breathing, nonlabored ventilation and respiratory function stable Cardiovascular status: blood pressure returned to baseline and stable Postop Assessment: no apparent nausea or vomiting Anesthetic complications: no   No notable events documented.   Last Vitals:  Vitals:   12/11/20 1026 12/11/20 1036  BP: 127/76 105/61  Pulse:    Resp:    Temp: (!) 36.3 C     Last Pain:  Vitals:   12/11/20 1046  TempSrc:   PainSc: 0-No pain                 Phill Mutter

## 2020-12-11 NOTE — Transfer of Care (Signed)
Immediate Anesthesia Transfer of Care Note  Patient: Richard Carlson  Procedure(s) Performed: COLONOSCOPY WITH PROPOFOL  Patient Location: PACU and Endoscopy Unit  Anesthesia Type:General  Level of Consciousness: drowsy  Airway & Oxygen Therapy: Patient Spontanous Breathing  Post-op Assessment: Report given to RN and Post -op Vital signs reviewed and stable  Post vital signs: Reviewed and stable  Last Vitals:  Vitals Value Taken Time  BP 127/76 12/11/20 1026  Temp    Pulse 68 12/11/20 1026  Resp 29 12/11/20 1026  SpO2 93 % 12/11/20 1026  Vitals shown include unvalidated device data.  Last Pain:  Vitals:   12/11/20 0948  TempSrc: Temporal  PainSc: 0-No pain         Complications: No notable events documented.

## 2020-12-14 ENCOUNTER — Encounter: Payer: Self-pay | Admitting: Gastroenterology

## 2020-12-14 LAB — SURGICAL PATHOLOGY

## 2020-12-17 ENCOUNTER — Ambulatory Visit: Payer: 59 | Admitting: Dermatology

## 2021-04-27 ENCOUNTER — Other Ambulatory Visit: Payer: Self-pay | Admitting: Internal Medicine

## 2021-04-27 DIAGNOSIS — I25810 Atherosclerosis of coronary artery bypass graft(s) without angina pectoris: Secondary | ICD-10-CM

## 2021-07-07 ENCOUNTER — Ambulatory Visit: Payer: 59 | Admitting: Dermatology

## 2021-08-17 DIAGNOSIS — R7303 Prediabetes: Secondary | ICD-10-CM | POA: Insufficient documentation

## 2021-08-17 DIAGNOSIS — Z0000 Encounter for general adult medical examination without abnormal findings: Secondary | ICD-10-CM | POA: Insufficient documentation

## 2021-11-29 ENCOUNTER — Ambulatory Visit: Payer: 59 | Admitting: Dermatology

## 2022-04-28 ENCOUNTER — Ambulatory Visit: Payer: 59 | Admitting: Dermatology

## 2022-04-28 DIAGNOSIS — D229 Melanocytic nevi, unspecified: Secondary | ICD-10-CM

## 2022-04-28 DIAGNOSIS — I872 Venous insufficiency (chronic) (peripheral): Secondary | ICD-10-CM

## 2022-04-28 DIAGNOSIS — L578 Other skin changes due to chronic exposure to nonionizing radiation: Secondary | ICD-10-CM

## 2022-04-28 DIAGNOSIS — D492 Neoplasm of unspecified behavior of bone, soft tissue, and skin: Secondary | ICD-10-CM | POA: Diagnosis not present

## 2022-04-28 DIAGNOSIS — Z1283 Encounter for screening for malignant neoplasm of skin: Secondary | ICD-10-CM | POA: Diagnosis not present

## 2022-04-28 DIAGNOSIS — L719 Rosacea, unspecified: Secondary | ICD-10-CM | POA: Diagnosis not present

## 2022-04-28 DIAGNOSIS — L219 Seborrheic dermatitis, unspecified: Secondary | ICD-10-CM

## 2022-04-28 DIAGNOSIS — Z79899 Other long term (current) drug therapy: Secondary | ICD-10-CM

## 2022-04-28 DIAGNOSIS — L821 Other seborrheic keratosis: Secondary | ICD-10-CM

## 2022-04-28 DIAGNOSIS — L814 Other melanin hyperpigmentation: Secondary | ICD-10-CM

## 2022-04-28 MED ORDER — KETOCONAZOLE 2 % EX CREA: TOPICAL_CREAM | CUTANEOUS | 6 refills | Status: DC

## 2022-04-28 NOTE — Progress Notes (Signed)
Follow-Up Visit   Subjective  Richard Carlson is a 52 y.o. male who presents for the following: Annual Exam.  Refill Ketoconazole cream for scaly dry places on his face. The patient presents for Total-Body Skin Exam (TBSE) for skin cancer screening and mole check.  The patient has spots, moles and lesions to be evaluated, some may be new or changing and the patient has concerns that these could be cancer.   The following portions of the chart were reviewed this encounter and updated as appropriate:   Tobacco  Allergies  Meds  Problems  Med Hx  Surg Hx  Fam Hx     Review of Systems:  No other skin or systemic complaints except as noted in HPI or Assessment and Plan.  Objective  Well appearing patient in no apparent distress; mood and affect are within normal limits.  A full examination was performed including scalp, head, eyes, ears, nose, lips, neck, chest, axillae, abdomen, back, buttocks, bilateral upper extremities, bilateral lower extremities, hands, feet, fingers, toes, fingernails, and toenails. All findings within normal limits unless otherwise noted below.  face Pink patches with greasy scale.   face Mid face erythema with telangiectasias +/- scattered inflammatory papules.   right ear canal Flesh papule   Right Lower Leg - Anterior Erythematous, scaly patches involving the ankle and distal lower leg with associated lower leg edema.    Assessment & Plan  Seborrheic dermatitis face Seborrheic Dermatitis  -  is a chronic persistent rash characterized by pinkness and scaling most commonly of the mid face but also can occur on the scalp (dandruff), ears; mid chest, mid back and groin.  It tends to be exacerbated by stress and cooler weather.  People who have neurologic disease may experience new onset or exacerbation of existing seborrheic dermatitis.  The condition is not curable but treatable and can be controlled.   Continue Ketoconazole 2% cream apply to face  once a day Mon,Wed, Fri   Related Medications ketoconazole (NIZORAL) 2 % cream Apply to affected areas every night on Mondays, Wednesdays, and Fridays.  Rosacea face Rosacea is a chronic progressive skin condition usually affecting the face of adults, causing redness and/or acne bumps. It is treatable but not curable. It sometimes affects the eyes (ocular rosacea) as well. It may respond to topical and/or systemic medication and can flare with stress, sun exposure, alcohol, exercise, topical steroids (including hydrocortisone/cortisone 10) and some foods.  Daily application of broad spectrum spf 30+ sunscreen to face is recommended to reduce flares.   Neoplasm of skin right ear canal Recommend evaluation with ENT doctor  Related Procedures Ambulatory referral to ENT  Stasis dermatitis of both legs Right Lower Leg - Anterior Schamberg purpura with stasis dermatitis  Stasis in the legs causes chronic leg swelling, which may result in itchy or painful rashes, skin discoloration, skin texture changes, and sometimes ulceration.  Recommend daily graduated compression hose/stockings- easiest to put on first thing in morning, remove at bedtime.  Elevate legs as much as possible. Avoid salt/sodium rich foods.   Lentigines - Scattered tan macules - Due to sun exposure - Benign-appearing, observe - Recommend daily broad spectrum sunscreen SPF 30+ to sun-exposed areas, reapply every 2 hours as needed. - Call for any changes  Seborrheic Keratoses - Stuck-on, waxy, tan-brown papules and/or plaques  - Benign-appearing - Discussed benign etiology and prognosis. - Observe - Call for any changes  Melanocytic Nevi - Tan-brown and/or pink-flesh-colored symmetric macules and papules - Benign appearing  on exam today - Observation - Call clinic for new or changing moles - Recommend daily use of broad spectrum spf 30+ sunscreen to sun-exposed areas.   Hemangiomas - Red papules - Discussed benign  nature - Observe - Call for any changes  Actinic Damage - Chronic condition, secondary to cumulative UV/sun exposure - diffuse scaly erythematous macules with underlying dyspigmentation - Recommend daily broad spectrum sunscreen SPF 30+ to sun-exposed areas, reapply every 2 hours as needed.  - Staying in the shade or wearing long sleeves, sun glasses (UVA+UVB protection) and wide brim hats (4-inch brim around the entire circumference of the hat) are also recommended for sun protection.  - Call for new or changing lesions.  Skin cancer screening performed today.   Return in about 1 year (around 04/29/2023) for TBSE, hx of Seb derm .  IMarye Round, CMA, am acting as scribe for Sarina Ser, MD .  Documentation: I have reviewed the above documentation for accuracy and completeness, and I agree with the above.  Sarina Ser, MD

## 2022-04-28 NOTE — Patient Instructions (Signed)
Due to recent changes in healthcare laws, you may see results of your pathology and/or laboratory studies on MyChart before the doctors have had a chance to review them. We understand that in some cases there may be results that are confusing or concerning to you. Please understand that not all results are received at the same time and often the doctors may need to interpret multiple results in order to provide you with the best plan of care or course of treatment. Therefore, we ask that you please give us 2 business days to thoroughly review all your results before contacting the office for clarification. Should we see a critical lab result, you will be contacted sooner.   If You Need Anything After Your Visit  If you have any questions or concerns for your doctor, please call our main line at [phone] and press option 4 to reach your doctor's medical assistant. If no one answers, please leave a voicemail as directed and we will return your call as soon as possible. Messages left after 4 pm will be answered the following business day.   You may also send us a message via MyChart. We typically respond to MyChart messages within 1-2 business days.  For prescription refills, please ask your pharmacy to contact our office. Our fax number is [phone].  If you have an urgent issue when the clinic is closed that cannot wait until the next business day, you can page your doctor at the number below.    Please note that while we do our best to be available for urgent issues outside of office hours, we are not available 24/7.   If you have an urgent issue and are unable to reach us, you may choose to seek medical care at your doctor's office, retail clinic, urgent care center, or emergency room.  If you have a medical emergency, please immediately call 911 or go to the emergency department.  Pager Numbers  - Dr. Kowalski: [phone]  - Dr. Moye: [phone]  - Dr. Stewart:  [phone]  In the event of inclement weather, please call our main line at [phone] for an update on the status of any delays or closures.  Dermatology Medication Tips: Please keep the boxes that topical medications come in in order to help keep track of the instructions about where and how to use these. Pharmacies typically print the medication instructions only on the boxes and not directly on the medication tubes.   If your medication is too expensive, please contact our office at [phone] option 4 or send us a message through MyChart.   We are unable to tell what your co-pay for medications will be in advance as this is different depending on your insurance coverage. However, we may be able to find a substitute medication at lower cost or fill out paperwork to get insurance to cover a needed medication.   If a prior authorization is required to get your medication covered by your insurance company, please allow us 1-2 business days to complete this process.  Drug prices often vary depending on where the prescription is filled and some pharmacies may offer cheaper prices.  The website www.goodrx.com contains coupons for medications through different pharmacies. The prices here do not account for what the cost may be with help from insurance (it may be cheaper with your insurance), but the website can give you the price if you did not use any insurance.  - You can print the associated coupon and take it with   your prescription to the pharmacy.  - You may also stop by our office during regular business hours and pick up a GoodRx coupon card.  - If you need your prescription sent electronically to a different pharmacy, notify our office through [facility] MyChart or by phone at [phone] option 4.     Si Usted Necesita Algo Despus de Su Visita  Tambin puede enviarnos un mensaje a travs de MyChart. Por lo general respondemos a los mensajes de MyChart en el transcurso de 1 a 2  das hbiles.  Para renovar recetas, por favor pida a su farmacia que se ponga en contacto con nuestra oficina. Nuestro nmero de fax es el [phone].  Si tiene un asunto urgente cuando la clnica est cerrada y que no puede esperar hasta el siguiente da hbil, puede llamar/localizar a su doctor(a) al nmero que aparece a continuacin.   Por favor, tenga en cuenta que aunque hacemos todo lo posible para estar disponibles para asuntos urgentes fuera del horario de oficina, no estamos disponibles las 24 horas del da, los 7 das de la semana.   Si tiene un problema urgente y no puede comunicarse con nosotros, puede optar por buscar atencin mdica  en el consultorio de su doctor(a), en una clnica privada, en un centro de atencin urgente o en una sala de emergencias.  Si tiene una emergencia mdica, por favor llame inmediatamente al 911 o vaya a la sala de emergencias.  Nmeros de bper  - Dr. Kowalski: [phone]  - Dra. Moye: [phone]  - Dra. Stewart: [phone]  En caso de inclemencias del tiempo, por favor llame a nuestra lnea principal al [phone] para una actualizacin sobre el estado de cualquier retraso o cierre.  Consejos para la medicacin en dermatologa: Por favor, guarde las cajas en las que vienen los medicamentos de uso tpico para ayudarle a seguir las instrucciones sobre dnde y cmo usarlos. Las farmacias generalmente imprimen las instrucciones del medicamento slo en las cajas y no directamente en los tubos del medicamento.   Si su medicamento es muy caro, por favor, pngase en contacto con nuestra oficina llamando al [phone] y presione la opcin 4 o envenos un mensaje a travs de MyChart.   No podemos decirle cul ser su copago por los medicamentos por adelantado ya que esto es diferente dependiendo de la cobertura de su seguro. Sin embargo, es posible que podamos encontrar un medicamento sustituto a menor costo o llenar un formulario para que el  seguro cubra el medicamento que se considera necesario.   Si se requiere una autorizacin previa para que su compaa de seguros cubra su medicamento, por favor permtanos de 1 a 2 das hbiles para completar este proceso.  Los precios de los medicamentos varan con frecuencia dependiendo del lugar de dnde se surte la receta y alguna farmacias pueden ofrecer precios ms baratos.  El sitio web www.goodrx.com tiene cupones para medicamentos de diferentes farmacias. Los precios aqu no tienen en cuenta lo que podra costar con la ayuda del seguro (puede ser ms barato con su seguro), pero el sitio web puede darle el precio si no utiliz ningn seguro.  - Puede imprimir el cupn correspondiente y llevarlo con su receta a la farmacia.  - Tambin puede pasar por nuestra oficina durante el horario de atencin regular y recoger una tarjeta de cupones de GoodRx.  - Si necesita que su receta se enve electrnicamente a una farmacia diferente, informe a nuestra oficina a travs de MyChart de [place]   o por telfono llamando al [phone] y presione la opcin 4.  

## 2022-05-06 ENCOUNTER — Encounter: Payer: Self-pay | Admitting: Dermatology

## 2022-06-13 ENCOUNTER — Other Ambulatory Visit: Payer: Self-pay | Admitting: Otolaryngology

## 2022-06-16 LAB — SURGICAL PATHOLOGY

## 2022-06-20 DIAGNOSIS — Z951 Presence of aortocoronary bypass graft: Secondary | ICD-10-CM | POA: Insufficient documentation

## 2023-03-03 ENCOUNTER — Emergency Department
Admission: EM | Admit: 2023-03-03 | Discharge: 2023-03-03 | Disposition: A | Discharge status: Home / Self Care | Payer: 59 | Attending: Emergency Medicine | Admitting: Emergency Medicine

## 2023-03-03 ENCOUNTER — Other Ambulatory Visit: Payer: Self-pay

## 2023-03-03 ENCOUNTER — Emergency Department: Payer: 59

## 2023-03-03 DIAGNOSIS — G510 Bell's palsy: Secondary | ICD-10-CM | POA: Insufficient documentation

## 2023-03-03 DIAGNOSIS — I10 Essential (primary) hypertension: Secondary | ICD-10-CM | POA: Insufficient documentation

## 2023-03-03 DIAGNOSIS — R531 Weakness: Secondary | ICD-10-CM | POA: Diagnosis present

## 2023-03-03 LAB — CBC
HCT: 44.2 % (ref 39.0–52.0)
Hemoglobin: 15.3 g/dL (ref 13.0–17.0)
MCH: 30.3 pg (ref 26.0–34.0)
MCHC: 34.6 g/dL (ref 30.0–36.0)
MCV: 87.5 fL (ref 80.0–100.0)
Platelets: 252 10*3/uL (ref 150–400)
RBC: 5.05 MIL/uL (ref 4.22–5.81)
RDW: 11.9 % (ref 11.5–15.5)
WBC: 6.5 10*3/uL (ref 4.0–10.5)
nRBC: 0 % (ref 0.0–0.2)

## 2023-03-03 LAB — COMPREHENSIVE METABOLIC PANEL
ALT: 52 U/L — ABNORMAL HIGH (ref 0–44)
AST: 38 U/L (ref 15–41)
Albumin: 4.1 g/dL (ref 3.5–5.0)
Alkaline Phosphatase: 86 U/L (ref 38–126)
Anion gap: 11 (ref 5–15)
BUN: 14 mg/dL (ref 6–20)
CO2: 27 mmol/L (ref 22–32)
Calcium: 9 mg/dL (ref 8.9–10.3)
Chloride: 101 mmol/L (ref 98–111)
Creatinine, Ser: 0.91 mg/dL (ref 0.61–1.24)
GFR, Estimated: >60 mL/min (ref >60)
Glucose, Bld: 125 mg/dL — ABNORMAL HIGH (ref 70–99)
Potassium: 3.9 mmol/L (ref 3.5–5.1)
Sodium: 139 mmol/L (ref 135–145)
Total Bilirubin: 1.3 mg/dL — ABNORMAL HIGH (ref <1.2)
Total Protein: 7.1 g/dL (ref 6.5–8.1)

## 2023-03-03 LAB — TROPONIN I (HIGH SENSITIVITY): Troponin I (High Sensitivity): 9 ng/L (ref <18)

## 2023-03-03 MED ORDER — PREDNISONE 20 MG PO TABS: 60.0000 mg | ORAL_TABLET | Freq: Every day | ORAL | 0 refills | Status: AC

## 2023-03-03 MED ORDER — VALACYCLOVIR HCL 1 G PO TABS: 1000.0000 mg | ORAL_TABLET | Freq: Three times a day (TID) | ORAL | 0 refills | Status: AC

## 2023-03-03 NOTE — ED Triage Notes (Signed)
Pt states that the right side of his face started drooping yesterday, pt reports a hx of bell's palsy in high school from having his wisdom teeth removed resolved, pt has equal sensation to his face, arms, and thighs, pt able to stick his tongue out without deviation, denies earache or fever, but states always has sinus congestion

## 2023-03-03 NOTE — ED Provider Notes (Addendum)
[facility] Provider Note    Event Date/Time   First MD Initiated Contact with Patient 03/03/23 [phone]     (approximate)   History   Chief Complaint: right sided facial weakness    HPI  Richard Carlson is a 52 y.o. male with a past history of hypertension, hyperlipidemia, GERD, Bell's palsy who comes ED complaining of right facial weakness that he noticed yesterday morning after waking up.  Weakness includes the right side of the mouth, eye closure, and forehead.  No change in vision, no headache, no fever, no trauma or neck pain.  No motor weakness or change in balance or coordination or language ability.          Physical Exam   Triage Vital Signs: ED Triage Vitals  Encounter Vitals Group     BP 03/03/23 0831 (!) 159/79     Systolic BP Percentile --      Diastolic BP Percentile --      Pulse Rate 03/03/23 0831 66     Resp 03/03/23 0831 18     Temp 03/03/23 0831 99.3 F (37.4 C)     Temp Source 03/03/23 0831 Oral     SpO2 03/03/23 0831 94 %     Weight 03/03/23 0834 300 lb (136.1 kg)     Height 03/03/23 0834 5\' 9"  (1.753 m)     Head Circumference --      Peak Flow --      Pain Score 03/03/23 0833 0     Pain Loc --      Pain Education --      Exclude from Growth Chart --     Most recent vital signs: Vitals:   03/03/23 0831  BP: (!) 159/79  Pulse: 66  Resp: 18  Temp: 99.3 F (37.4 C)  SpO2: 94%    General: Awake, no distress.  CV:  Good peripheral perfusion.  Regular rate rhythm Resp:  Normal effort.  Clear to auscultation bilaterally Abd:  No distention.  Other:  Paralysis of the right side of the face including the forehead.  Extraocular movements are intact.  PERRL.  No rash.  External ear canal and TM are normal without vesicles.  No motor weakness.  Normal coordination and cerebellar function   ED Results / Procedures / Treatments   Labs (all labs ordered are listed, but only abnormal results are displayed) Labs  Reviewed  COMPREHENSIVE METABOLIC PANEL - Abnormal; Notable for the following components:      Result Value   Glucose, Bld 125 (*)    ALT 52 (*)    Total Bilirubin 1.3 (*)    All other components within normal limits  CBC  TROPONIN I (HIGH SENSITIVITY)     EKG    RADIOLOGY CT head interpreted by me, unremarkable.  Radiology report reviewed   PROCEDURES:  Procedures   MEDICATIONS ORDERED IN ED: Medications - No data to display   IMPRESSION / MDM / ASSESSMENT AND PLAN / ED COURSE  I reviewed the triage vital signs and the nursing notes.  DDx: Bell's palsy, intracranial hemorrhage, electrolyte abnormality, dehydration  Patient's presentation is most consistent with acute presentation with potential threat to life or bodily function.  Patient presents with clinically apparent Bell's palsy.  Doubt stroke, meningitis, dissection, or other vascular pathology.  Initial workup reassuring.  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  No recent cerebellar symptoms.  Anatomically this location does not correspond to his current symptoms.  Informed patient and spouse recommendation of follow-up with neurology for further assessment.     FINAL CLINICAL IMPRESSION(S) / ED DIAGNOSES   Final diagnoses:  Bell's palsy     Rx / DC Orders   ED Discharge Orders          Ordered    valACYclovir (VALTREX) 1000 MG tablet  3 times daily        03/03/23 0947    predniSONE (DELTASONE) 20 MG tablet  Daily with breakfast        03/03/23 0947             Note:  This document was prepared using Dragon voice recognition software and may include unintentional dictation errors.   Sharman Cheek, MD 03/03/23 [id number]    Sharman Cheek, MD 03/03/23 [phone]

## 2023-03-03 NOTE — ED Triage Notes (Signed)
Pt here from [facility] with facial droop on the right side and loss of taste that started yesterday morning. Pt denies hx of stroke. Pt ambulatory to triage with family.

## 2023-05-03 ENCOUNTER — Ambulatory Visit: Payer: 59 | Admitting: Dermatology

## 2023-06-06 ENCOUNTER — Ambulatory Visit: Payer: 59 | Admitting: Dermatology

## 2023-06-06 ENCOUNTER — Encounter: Payer: Self-pay | Admitting: Dermatology

## 2023-06-06 DIAGNOSIS — Z1283 Encounter for screening for malignant neoplasm of skin: Secondary | ICD-10-CM | POA: Diagnosis not present

## 2023-06-06 DIAGNOSIS — L570 Actinic keratosis: Secondary | ICD-10-CM

## 2023-06-06 DIAGNOSIS — E669 Obesity, unspecified: Secondary | ICD-10-CM | POA: Insufficient documentation

## 2023-06-06 DIAGNOSIS — L219 Seborrheic dermatitis, unspecified: Secondary | ICD-10-CM

## 2023-06-06 DIAGNOSIS — L814 Other melanin hyperpigmentation: Secondary | ICD-10-CM | POA: Diagnosis not present

## 2023-06-06 DIAGNOSIS — D1801 Hemangioma of skin and subcutaneous tissue: Secondary | ICD-10-CM

## 2023-06-06 DIAGNOSIS — L821 Other seborrheic keratosis: Secondary | ICD-10-CM

## 2023-06-06 DIAGNOSIS — D229 Melanocytic nevi, unspecified: Secondary | ICD-10-CM

## 2023-06-06 DIAGNOSIS — L578 Other skin changes due to chronic exposure to nonionizing radiation: Secondary | ICD-10-CM

## 2023-06-06 DIAGNOSIS — I2510 Atherosclerotic heart disease of native coronary artery without angina pectoris: Secondary | ICD-10-CM | POA: Insufficient documentation

## 2023-06-06 DIAGNOSIS — W908XXA Exposure to other nonionizing radiation, initial encounter: Secondary | ICD-10-CM

## 2023-06-06 DIAGNOSIS — I259 Chronic ischemic heart disease, unspecified: Secondary | ICD-10-CM | POA: Insufficient documentation

## 2023-06-06 DIAGNOSIS — E7800 Pure hypercholesterolemia, unspecified: Secondary | ICD-10-CM | POA: Insufficient documentation

## 2023-06-06 DIAGNOSIS — D239 Other benign neoplasm of skin, unspecified: Secondary | ICD-10-CM

## 2023-06-06 MED ORDER — KETOCONAZOLE 2 % EX CREA: TOPICAL_CREAM | CUTANEOUS | 6 refills | Status: AC

## 2023-06-06 MED ORDER — HYDROCORTISONE 2.5 % EX CREA
TOPICAL_CREAM | Freq: Two times a day (BID) | CUTANEOUS | 5 refills | Status: AC | PRN
Start: 2023-06-06 — End: ?

## 2023-06-06 NOTE — Progress Notes (Signed)
 Follow-Up Visit   Subjective  Richard Carlson is a 53 y.o. male who presents for the following: Skin Cancer Screening and Full Body Skin Exam  The patient presents for Total-Body Skin Exam (TBSE) for skin cancer screening and mole check. The patient has spots, moles and lesions to be evaluated, some may be new or changing and the patient may have concern these could be cancer.  No hx skin cancer. Hx of seb derm at face, uses ketoconazole which seems to help.   The following portions of the chart were reviewed this encounter and updated as appropriate: medications, allergies, medical history  Review of Systems:  No other skin or systemic complaints except as noted in HPI or Assessment and Plan.  Objective  Well appearing patient in no apparent distress; mood and affect are within normal limits.  A full examination was performed including scalp, head, eyes, ears, nose, lips, neck, chest, axillae, abdomen, back, buttocks, bilateral upper extremities, bilateral lower extremities, hands, feet, fingers, toes, fingernails, and toenails. All findings within normal limits unless otherwise noted below.   Relevant physical exam findings are noted in the Assessment and Plan.  L mid helix x 1 Erythematous thin papules/macules with gritty scale.   Assessment & Plan   SKIN CANCER SCREENING PERFORMED TODAY.  ACTINIC DAMAGE - Chronic condition, secondary to cumulative UV/sun exposure - diffuse scaly erythematous macules with underlying dyspigmentation - Recommend daily broad spectrum sunscreen SPF 30+ to sun-exposed areas, reapply every 2 hours as needed.  - Staying in the shade or wearing long sleeves, sun glasses (UVA+UVB protection) and wide brim hats (4-inch brim around the entire circumference of the hat) are also recommended for sun protection.  - Call for new or changing lesions.  LENTIGINES, SEBORRHEIC KERATOSES, HEMANGIOMAS - Benign normal skin lesions - Benign-appearing - Call for  any changes  MELANOCYTIC NEVI - Tan-brown and/or pink-flesh-colored symmetric macules and papules - Benign appearing on exam today - Observation - Call clinic for new or changing moles - Recommend daily use of broad spectrum spf 30+ sunscreen to sun-exposed areas.   DERMATOFIBROMA Exam: Firm pink/brown papulenodule with dimple sign. Treatment Plan: A dermatofibroma is a benign growth possibly related to trauma, such as an insect bite, cut from shaving, or inflamed acne-type bump.  Treatment options to remove include shave or excision with resulting scar and risk of recurrence.  Since benign-appearing and not bothersome, will observe for now.   SEBORRHEIC DERMATITIS Exam: Pink patches with greasy scale at perinasal   flaring  Seborrheic Dermatitis is a chronic persistent rash characterized by pinkness and scaling most commonly of the mid face but also can occur on the scalp (dandruff), ears; mid chest, mid back and groin.  It tends to be exacerbated by stress and cooler weather.  People who have neurologic disease may experience new onset or exacerbation of existing seborrheic dermatitis.  The condition is not curable but treatable and can be controlled.  Treatment Plan: Continue ketoconazole 2% cr 3 x weekly   Start hydrocortisone 2.5% cream BID until clear  AK (ACTINIC KERATOSIS) L mid helix x 1 Actinic keratoses are precancerous spots that appear secondary to cumulative UV radiation exposure/sun exposure over time. They are chronic with expected duration over 1 year. A portion of actinic keratoses will progress to squamous cell carcinoma of the skin. It is not possible to reliably predict which spots will progress to skin cancer and so treatment is recommended to prevent development of skin cancer.  Recommend daily broad  spectrum sunscreen SPF 30+ to sun-exposed areas, reapply every 2 hours as needed.  Recommend staying in the shade or wearing long sleeves, sun glasses (UVA+UVB  protection) and wide brim hats (4-inch brim around the entire circumference of the hat). Call for new or changing lesions.  SEBORRHEIC DERMATITIS   Related Medications ketoconazole (NIZORAL) 2 % cream Apply to affected areas every night on Mondays, Wednesdays, and Fridays. hydrocortisone 2.5 % cream Apply topically 2 (two) times daily as needed (Rash). Alternating with ketoconazole LENTIGINES   CHERRY ANGIOMA   MULTIPLE BENIGN NEVI   ACTINIC ELASTOSIS   Return in about 1 year (around 06/05/2024) for TBSE.  Anise Salvo, RMA, am acting as scribe for Elie Goody, MD .   Documentation: I have reviewed the above documentation for accuracy and completeness, and I agree with the above.  Elie Goody, MD

## 2023-06-06 NOTE — Patient Instructions (Signed)

## 2023-09-04 ENCOUNTER — Other Ambulatory Visit: Payer: Self-pay | Admitting: Cardiovascular Disease

## 2023-09-04 DIAGNOSIS — R9439 Abnormal result of other cardiovascular function study: Secondary | ICD-10-CM

## 2023-09-28 ENCOUNTER — Ambulatory Visit

## 2023-10-03 ENCOUNTER — Other Ambulatory Visit: Payer: Self-pay | Admitting: Cardiovascular Disease

## 2023-10-03 DIAGNOSIS — R9439 Abnormal result of other cardiovascular function study: Secondary | ICD-10-CM

## 2023-10-04 ENCOUNTER — Encounter (INDEPENDENT_AMBULATORY_CARE_PROVIDER_SITE_OTHER): Payer: Self-pay | Admitting: Nurse Practitioner

## 2023-10-31 ENCOUNTER — Encounter (INDEPENDENT_AMBULATORY_CARE_PROVIDER_SITE_OTHER): Payer: Self-pay | Admitting: Nurse Practitioner

## 2023-11-09 ENCOUNTER — Ambulatory Visit (INDEPENDENT_AMBULATORY_CARE_PROVIDER_SITE_OTHER): Payer: Self-pay | Admitting: Nurse Practitioner

## 2023-11-09 ENCOUNTER — Encounter (INDEPENDENT_AMBULATORY_CARE_PROVIDER_SITE_OTHER): Payer: Self-pay | Admitting: Nurse Practitioner

## 2023-11-09 VITALS — BP 109/75 | HR 88 | Resp 18 | Ht 69.0 in | Wt 277.4 lb

## 2023-11-09 DIAGNOSIS — E782 Mixed hyperlipidemia: Secondary | ICD-10-CM | POA: Diagnosis not present

## 2023-11-09 DIAGNOSIS — I10 Essential (primary) hypertension: Secondary | ICD-10-CM

## 2023-11-09 DIAGNOSIS — I6523 Occlusion and stenosis of bilateral carotid arteries: Secondary | ICD-10-CM | POA: Diagnosis not present

## 2023-11-09 NOTE — Progress Notes (Signed)
 Subjective:    Patient ID: Richard Carlson, male    DOB: 1970-09-16, 53 y.o.   MRN: [id number] Chief Complaint  Patient presents with   Establish Care    New patient consult hx stroke + carotid stenosis ref. jonette    The patient presents today as a referral from Lauraine jonette, PA-C in regards to concern for carotid artery stenosis.  The patient was diagnosed with Bell's palsy late last year in the midst of the workup it was found that the patient had a CVA but they were unsure of the timing.  They suspect that it was within the last 10 years.  Today he denies any TIA or CVA-like symptoms.  Denies any symptoms or amaurosis fugax.  In the midst of the workup the patient underwent a carotid artery duplex which showed no significant internal carotid artery stenosis bilaterally.  He had antegrade flow in the bilateral vertebral arteries.  Only area of concern was some plaque within the external carotid artery.    Review of Systems  All other systems reviewed and are negative.      Objective:   Physical Exam Vitals reviewed.  HENT:     Head: Normocephalic.  Cardiovascular:     Rate and Rhythm: Normal rate.     Pulses: Normal pulses.  Pulmonary:     Effort: Pulmonary effort is normal.  Skin:    General: Skin is warm and dry.  Neurological:     Mental Status: He is alert and oriented to person, place, and time.  Psychiatric:        Mood and Affect: Mood normal.        Behavior: Behavior normal.        Thought Content: Thought content normal.        Judgment: Judgment normal.     BP 109/75 (BP Location: Right Arm, Patient Position: Sitting, Cuff Size: Large)   Pulse 88   Resp 18   Ht 5' 9 (1.753 m)   Wt 277 lb 6.4 oz (125.8 kg)   BMI 40.96 kg/m   Past Medical History:  Diagnosis Date   Coronary artery disease    GERD (gastroesophageal reflux disease)    HLD (hyperlipidemia)    Hypertension    Long-term current use of testosterone replacement therapy    Myocardial  infarction [facility])     Social History   Socioeconomic History   Marital status: Married    Spouse name: Not on file   Number of children: Not on file   Years of education: Not on file   Highest education level: Not on file  Occupational History   Not on file  Tobacco Use   Smoking status: Former    Types: Cigarettes   Smokeless tobacco: Never  Vaping Use   Vaping status: Never Used  Substance and Sexual Activity   Alcohol use: Never   Drug use: Never   Sexual activity: Not on file  Other Topics Concern   Not on file  Social History Narrative   Not on file   Social Drivers of Health   Financial Resource Strain: Low Risk  (08/22/2023)   Received from [facility]   Overall Financial Resource Strain (CARDIA)    Difficulty of Paying Living Expenses: Not hard at all  Food Insecurity: No Food Insecurity (08/22/2023)   Received from [facility]   Hunger Vital Sign    Within the past 12 months, you worried that your food would run  out before you got the money to buy more.: Never true    Within the past 12 months, the food you bought just didn't last and you didn't have money to get more.: Never true  Transportation Needs: No Transportation Needs (08/22/2023)   Received from [facility] - Transportation    In the past 12 months, has lack of transportation kept you from medical appointments or from getting medications?: No    Lack of Transportation (Non-Medical): No  Physical Activity: Not on file  Stress: Not on file  Social Connections: Not on file  Intimate Partner Violence: Not on file    Past Surgical History:  Procedure Laterality Date   COLONOSCOPY WITH PROPOFOL  N/A 12/11/2020   Procedure: COLONOSCOPY WITH PROPOFOL ;  Surgeon: Maryruth Ole DASEN, MD;  Location: [facility] ENDOSCOPY;  Service: Endoscopy;  Laterality: N/A;   CORONARY ARTERY BYPASS GRAFT     triple bypass   prosthetic heart valves  2013   Echo  07/06/2020 EF 50-55    History reviewed. No pertinent family history.  No Known Allergies     Latest Ref Rng & Units 03/03/2023    8:38 AM  CBC  WBC 4.0 - 10.5 K/uL 6.5   Hemoglobin 13.0 - 17.0 g/dL 84.6   Hematocrit 60.9 - 52.0 % 44.2   Platelets 150 - 400 K/uL 252       CMP     Component Value Date/Time   NA 139 03/03/2023 0838   K 3.9 03/03/2023 0838   CL 101 03/03/2023 0838   CO2 27 03/03/2023 0838   GLUCOSE 125 (H) 03/03/2023 0838   BUN 14 03/03/2023 0838   CREATININE 0.91 03/03/2023 0838   CALCIUM 9.0 03/03/2023 0838   PROT 7.1 03/03/2023 0838   ALBUMIN 4.1 03/03/2023 0838   AST 38 03/03/2023 0838   ALT 52 (H) 03/03/2023 0838   ALKPHOS 86 03/03/2023 0838   BILITOT 1.3 (H) 03/03/2023 0838   GFRNONAA >60 03/03/2023 0838     No results found.     Assessment & Plan:   1. Bilateral carotid artery stenosis (Primary) The patient's bilateral internal carotid arteries did not show any significant stenosis bilaterally.  In addition he had antegrade flow in his bilateral vertebral arteries.  The only area of concern was of his left external carotid artery however typically these do not undergo intervention they are treating with medical therapy including use of aspirin and statin which the patient is already on.  Based on this no intervention is recommended at this time.  We will continue to follow the patient on an annual basis given his risk factors and his history of a CVA.  2. Hyperlipidemia, mixed Continue statin as ordered and reviewed, no changes at this time  3. Benign essential HTN Continue antihypertensive medications as already ordered, these medications have been reviewed and there are no changes at this time.   Current Outpatient Medications on File Prior to Visit  Medication Sig Dispense Refill   aspirin 81 MG EC tablet Take 1 tablet by mouth daily.     atorvastatin (LIPITOR) 80 MG tablet Take 1 tablet by mouth daily.     cetirizine (ZYRTEC) 10 MG  tablet Take 1 tablet by mouth daily.     cyanocobalamin 100 MCG tablet Take 1 tablet by mouth daily.     DOCOSAHEXAENOIC ACID-EPA PO Take by mouth.     ezetimibe (ZETIA) 10 MG tablet Take 10 mg by mouth daily.  hydrocortisone  2.5 % cream Apply topically 2 (two) times daily as needed (Rash). Alternating with ketoconazole  30 g 5   INULIN FIBER PREBIOTIC PO Take 2 capsules by mouth 2 (two) times daily.     ketoconazole  (NIZORAL ) 2 % cream Apply to affected areas every night on Mondays, Wednesdays, and Fridays. 30 g 6   lisinopril (ZESTRIL) 10 MG tablet Take 10 mg by mouth daily.     metoprolol tartrate (LOPRESSOR) 50 MG tablet Take 50 mg by mouth 2 (two) times daily.     Multiple Vitamin (MULTIVITAMIN ADULT PO) Take 1 tablet by mouth daily.     naproxen (NAPROSYN) 250 MG tablet Take by mouth.     omeprazole (PRILOSEC) 20 MG capsule Take 20 mg by mouth daily.     testosterone cypionate (DEPOTESTOSTERONE CYPIONATE) 200 MG/ML injection Inject into the muscle.     tirzepatide (MOUNJARO) 7.5 MG/0.5ML Pen Inject 5 mg into the skin once a week.     valsartan (DIOVAN) 80 MG tablet Take 80 mg by mouth daily.     No current facility-administered medications on file prior to visit.    There are no Patient Instructions on file for this visit. No follow-ups on file.   Hisako Bugh E Hayde Kilgour, NP

## 2024-06-04 ENCOUNTER — Ambulatory Visit: Admitting: Dermatology

## 2024-11-07 ENCOUNTER — Encounter (INDEPENDENT_AMBULATORY_CARE_PROVIDER_SITE_OTHER)

## 2024-11-07 ENCOUNTER — Ambulatory Visit (INDEPENDENT_AMBULATORY_CARE_PROVIDER_SITE_OTHER): Admitting: Vascular Surgery
